# Patient Record
Sex: Female | Born: 1950 | Race: White | Hispanic: No | Marital: Married | Smoking: Current every day smoker
Health system: Southern US, Community
[De-identification: ages and names within clinical notes are randomized; demographics above are authoritative.]

## PROBLEM LIST (undated history)

## (undated) DIAGNOSIS — K625 Hemorrhage of anus and rectum: Secondary | ICD-10-CM

## (undated) DIAGNOSIS — G4700 Insomnia, unspecified: Secondary | ICD-10-CM

## (undated) DIAGNOSIS — G8929 Other chronic pain: Secondary | ICD-10-CM

## (undated) DIAGNOSIS — E785 Hyperlipidemia, unspecified: Secondary | ICD-10-CM

## (undated) DIAGNOSIS — M1990 Unspecified osteoarthritis, unspecified site: Secondary | ICD-10-CM

## (undated) DIAGNOSIS — J449 Chronic obstructive pulmonary disease, unspecified: Secondary | ICD-10-CM

## (undated) DIAGNOSIS — M549 Dorsalgia, unspecified: Secondary | ICD-10-CM

## (undated) DIAGNOSIS — F411 Generalized anxiety disorder: Secondary | ICD-10-CM

## (undated) HISTORY — DX: Hyperlipidemia, unspecified: E78.5

## (undated) HISTORY — DX: Insomnia, unspecified: G47.00

## (undated) HISTORY — DX: Unspecified osteoarthritis, unspecified site: M19.90

## (undated) HISTORY — DX: Generalized anxiety disorder: F41.1

## (undated) HISTORY — DX: Chronic obstructive pulmonary disease, unspecified: J44.9

## (undated) HISTORY — PX: DILATION AND CURETTAGE OF UTERUS: SHX78

## (undated) HISTORY — DX: Hemorrhage of anus and rectum: K62.5

## (undated) HISTORY — DX: Other chronic pain: G89.29

## (undated) HISTORY — DX: Dorsalgia, unspecified: M54.9

---

## 1997-10-04 ENCOUNTER — Ambulatory Visit (HOSPITAL_COMMUNITY): Admission: RE | Admit: 1997-10-04 | Discharge: 1997-10-04 | Payer: Self-pay | Admitting: Neurosurgery

## 1997-10-17 ENCOUNTER — Ambulatory Visit (HOSPITAL_COMMUNITY): Admission: RE | Admit: 1997-10-17 | Discharge: 1997-10-17 | Payer: Self-pay | Admitting: Neurosurgery

## 1997-10-31 ENCOUNTER — Ambulatory Visit (HOSPITAL_COMMUNITY): Admission: RE | Admit: 1997-10-31 | Discharge: 1997-10-31 | Payer: Self-pay | Admitting: Neurosurgery

## 1998-10-17 ENCOUNTER — Other Ambulatory Visit: Admission: RE | Admit: 1998-10-17 | Discharge: 1998-10-17 | Payer: Self-pay | Admitting: Obstetrics & Gynecology

## 1999-10-17 ENCOUNTER — Other Ambulatory Visit: Admission: RE | Admit: 1999-10-17 | Discharge: 1999-10-17 | Payer: Self-pay | Admitting: Obstetrics & Gynecology

## 2000-07-14 ENCOUNTER — Encounter: Payer: Self-pay | Admitting: Neurosurgery

## 2000-07-14 ENCOUNTER — Encounter: Admission: RE | Admit: 2000-07-14 | Discharge: 2000-07-14 | Payer: Self-pay | Admitting: Neurosurgery

## 2000-10-21 ENCOUNTER — Other Ambulatory Visit: Admission: RE | Admit: 2000-10-21 | Discharge: 2000-10-21 | Payer: Self-pay | Admitting: Obstetrics & Gynecology

## 2001-01-07 ENCOUNTER — Ambulatory Visit (HOSPITAL_COMMUNITY): Admission: RE | Admit: 2001-01-07 | Discharge: 2001-01-07 | Payer: Self-pay | Admitting: Obstetrics & Gynecology

## 2001-12-04 ENCOUNTER — Encounter: Payer: Self-pay | Admitting: Family Medicine

## 2001-12-04 ENCOUNTER — Ambulatory Visit (HOSPITAL_COMMUNITY): Admission: RE | Admit: 2001-12-04 | Discharge: 2001-12-04 | Payer: Self-pay | Admitting: Family Medicine

## 2002-03-25 ENCOUNTER — Other Ambulatory Visit: Admission: RE | Admit: 2002-03-25 | Discharge: 2002-03-25 | Payer: Self-pay | Admitting: Dermatology

## 2002-09-12 ENCOUNTER — Emergency Department (HOSPITAL_COMMUNITY): Admission: EM | Admit: 2002-09-12 | Discharge: 2002-09-12 | Payer: Self-pay | Admitting: Emergency Medicine

## 2002-09-14 ENCOUNTER — Ambulatory Visit (HOSPITAL_COMMUNITY): Admission: RE | Admit: 2002-09-14 | Discharge: 2002-09-14 | Payer: Self-pay | Admitting: Family Medicine

## 2002-09-14 ENCOUNTER — Encounter: Payer: Self-pay | Admitting: Family Medicine

## 2002-10-21 ENCOUNTER — Encounter (HOSPITAL_COMMUNITY): Admission: RE | Admit: 2002-10-21 | Discharge: 2002-11-20 | Payer: Self-pay | Admitting: Rheumatology

## 2002-10-28 ENCOUNTER — Encounter: Payer: Self-pay | Admitting: *Deleted

## 2002-10-28 ENCOUNTER — Ambulatory Visit (HOSPITAL_COMMUNITY): Admission: RE | Admit: 2002-10-28 | Discharge: 2002-10-28 | Payer: Self-pay | Admitting: *Deleted

## 2002-12-10 ENCOUNTER — Ambulatory Visit (HOSPITAL_COMMUNITY): Admission: RE | Admit: 2002-12-10 | Discharge: 2002-12-10 | Payer: Self-pay | Admitting: *Deleted

## 2003-04-07 ENCOUNTER — Other Ambulatory Visit: Admission: RE | Admit: 2003-04-07 | Discharge: 2003-04-07 | Payer: Self-pay | Admitting: Dermatology

## 2003-04-18 ENCOUNTER — Encounter: Payer: Self-pay | Admitting: Family Medicine

## 2003-04-18 ENCOUNTER — Ambulatory Visit (HOSPITAL_COMMUNITY): Admission: RE | Admit: 2003-04-18 | Discharge: 2003-04-18 | Payer: Self-pay | Admitting: Family Medicine

## 2003-11-23 ENCOUNTER — Ambulatory Visit (HOSPITAL_COMMUNITY): Admission: RE | Admit: 2003-11-23 | Discharge: 2003-11-23 | Payer: Self-pay | Admitting: *Deleted

## 2004-02-28 ENCOUNTER — Ambulatory Visit (HOSPITAL_COMMUNITY): Admission: RE | Admit: 2004-02-28 | Discharge: 2004-02-28 | Payer: Self-pay | Admitting: Family Medicine

## 2005-10-29 ENCOUNTER — Ambulatory Visit (HOSPITAL_COMMUNITY): Admission: RE | Admit: 2005-10-29 | Discharge: 2005-10-29 | Payer: Self-pay | Admitting: Family Medicine

## 2006-01-16 ENCOUNTER — Encounter: Admission: RE | Admit: 2006-01-16 | Discharge: 2006-01-16 | Payer: Self-pay | Admitting: Neurosurgery

## 2006-02-06 ENCOUNTER — Encounter: Admission: RE | Admit: 2006-02-06 | Discharge: 2006-02-06 | Payer: Self-pay | Admitting: Neurosurgery

## 2006-03-31 ENCOUNTER — Ambulatory Visit: Payer: Self-pay | Admitting: Gastroenterology

## 2006-11-24 ENCOUNTER — Ambulatory Visit (HOSPITAL_COMMUNITY): Admission: RE | Admit: 2006-11-24 | Discharge: 2006-11-24 | Payer: Self-pay | Admitting: Family Medicine

## 2007-08-07 ENCOUNTER — Ambulatory Visit (HOSPITAL_COMMUNITY): Admission: RE | Admit: 2007-08-07 | Discharge: 2007-08-07 | Payer: Self-pay | Admitting: General Surgery

## 2007-08-07 ENCOUNTER — Encounter (INDEPENDENT_AMBULATORY_CARE_PROVIDER_SITE_OTHER): Payer: Self-pay | Admitting: General Surgery

## 2007-10-19 ENCOUNTER — Other Ambulatory Visit: Admission: RE | Admit: 2007-10-19 | Discharge: 2007-10-19 | Payer: Self-pay | Admitting: Obstetrics and Gynecology

## 2008-03-17 ENCOUNTER — Ambulatory Visit (HOSPITAL_COMMUNITY): Admission: RE | Admit: 2008-03-17 | Discharge: 2008-03-17 | Payer: Self-pay | Admitting: Family Medicine

## 2009-02-06 ENCOUNTER — Other Ambulatory Visit: Admission: RE | Admit: 2009-02-06 | Discharge: 2009-02-06 | Payer: Self-pay | Admitting: Obstetrics and Gynecology

## 2009-03-21 ENCOUNTER — Ambulatory Visit (HOSPITAL_COMMUNITY): Admission: RE | Admit: 2009-03-21 | Discharge: 2009-03-21 | Payer: Self-pay | Admitting: Family Medicine

## 2010-03-27 ENCOUNTER — Ambulatory Visit (HOSPITAL_COMMUNITY): Admission: RE | Admit: 2010-03-27 | Discharge: 2010-03-27 | Payer: Self-pay | Admitting: Family Medicine

## 2010-07-15 ENCOUNTER — Encounter: Payer: Self-pay | Admitting: Neurosurgery

## 2010-07-15 ENCOUNTER — Encounter: Payer: Self-pay | Admitting: *Deleted

## 2010-07-15 ENCOUNTER — Encounter: Payer: Self-pay | Admitting: Family Medicine

## 2010-11-06 NOTE — H&P (Signed)
NAME:  Ellen Reynolds, Ellen Reynolds              ACCOUNT NO.:  [id number]   MEDICAL RECORD NO.:  [id number]          PATIENT TYPE:  AMB   LOCATION:  DAY                           FACILITY:  [facility]   PHYSICIAN:  Dalia Heading, M.D.  DATE OF BIRTH:  Jan 26, 1951   DATE OF ADMISSION:  DATE OF DISCHARGE:  LH                              HISTORY & PHYSICAL   CHIEF COMPLAINT:  Hematochezia.   HISTORY OF PRESENT ILLNESS:  The patient is a 60 year old white female  who is referred for endoscopic evaluation.  She needs a colonoscopy due  to hematochezia.  She has had some blood per rectum over the past few  weeks.  No abdominal pain, weight loss, nausea, vomiting, diarrhea,  constipation or melena have been noted.  She has never had a  colonoscopy.  There is no family history of colon carcinoma.   PAST MEDICAL HISTORY:  Unremarkable.   PAST SURGICAL HISTORY:  D&C's.   CURRENT MEDICATIONS:  Ibuprofen, Prilosec as needed.   ALLERGIES:  SULFA, CODEINE, MACRODANTIN.   REVIEW OF SYSTEMS:  The patient smokes a pack of cigarettes a day.  She  denies alcohol use.  She denies any cardiopulmonary difficulties or  bleeding disorders.   PHYSICAL EXAMINATION:  The patient is a well-developed, well-nourished  white female in no acute distress.  LUNGS:  Clear to auscultation with equal breath sounds bilaterally.  HEART:  A regular rate and rhythm without S3, S4 or murmurs.  ABDOMEN:  Soft, nontender, nondistended.  No hepatosplenomegaly or  masses are noted.  RECTAL:  Deferred to the procedure.   IMPRESSION:  Hematochezia.   PLAN:  The patient is scheduled for a colonoscopy on August 07, 2007.  The risks and benefits of the procedure including bleeding and  perforation were fully explained to the patient, who gave informed  consent.      Dalia Heading, M.D.  Electronically Signed     MAJ/MEDQ  D:  08/06/2007  T:  08/06/2007  Job:  [id number]   cc:   [facility]  Fax: [phone]   Corrie Mckusick, M.D.  Fax: [phone]

## 2010-11-09 NOTE — Consult Note (Signed)
NAMEMICHALA, Ellen Reynolds NO.:  [id number]   MEDICAL RECORD NO.:  [id number]          PATIENT TYPE:  AMB   LOCATION:  DAY                           FACILITY:  [facility]   PHYSICIAN:  Kassie Mends, M.D.      DATE OF BIRTH:  09-18-1950   DATE OF CONSULTATION:  03/31/2006  DATE OF DISCHARGE:                                   CONSULTATION   REFERRING PHYSICIAN:  Corrie Mckusick, M.D.   REASON FOR CONSULTATION:  Chest pain and difficulty swallowing.   HISTORY OF PRESENT ILLNESS:  Ellen Reynolds is a 60 year old female who  complains of a choking sensation that begins in her chest and radiates into  her neck up through her jaw into her teeth and into her ears. It makes both  her teeth and ears throb. The last episode was 3 weeks ago. It lasted  approximately 10 minutes. It occurred while she was driving. She has had a  total of 5 to 6 of these episodes over the last year. She has no triggers.  She has been awakened in the middle of the night with these episodes twice.  One has happened while driving, and the other has happened while watching  TV. The other episodes she cannot remember when they occurred. She was  started on Aciphex by Dr. Phillips Odor but says that the Aciphex was too  expensive, and she will not be continuing to take it. She does have  difficulty swallowing peanut butter less than once a month. She denies any  heartburn. She does have indigestion less than once a month. She denies any  nausea, vomiting, blood in her stool, black tarry stools, abdominal pain,  weight loss, constipation, or diarrhea.   PAST MEDICAL HISTORY:  1. COPD.  2. Back problems.   PAST SURGICAL HISTORY:  Multiple D&Cs.   ALLERGIES:  SULFA, MACRODANTIN AND CODEINE.   MEDICATIONS:  1. Aciphex 20 mg daily.  2. Naproxen 500 mg twice a day.  3. Coral calcium 1 g daily.  4. Spiriva.  5. Xopenex p.r.n.   FAMILY HISTORY:  She has no family history of colon cancer or colon polyps.  Her father  had melanoma at age 82.   SOCIAL HISTORY:  She is married and has one child 13 years ago. 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 She smokes a pack a day. She  denies any alcohol use.   REVIEW OF SYSTEMS:  She has one bowel movement daily. Review of systems is  per the HPI. Otherwise, all systems are negative.   PHYSICAL EXAMINATION:  Weight 159 pounds, height 5 foot 2 inches, BMI 29.1  (slightly overweight). Temperature 98.2, blood pressure 110/72, pulse 76.  GENERAL:  She is in no apparent distress, alert and oriented x4.  HEENT:  Atraumatic and normocephalic. Pupils are equal, round, and reactive  to light. Mouth:  No oral lesions. Posterior pharynx without erythema or  exudate.  NECK:  Has full range of motion. No lymphadenopathy.  LUNGS:  Clear to auscultation bilaterally.  CARDIOVASCULAR EXAM:  Regular rhythm. No murmur.  Normal S1 and S2.  ABDOMEN:  Bowel sounds are present, soft, nontender, nondistended. No  rebound or guarding. No hepatosplenomegaly.  EXTREMITIES:  Without clubbing, cyanosis, or edema.  NEUROLOGICAL:  She has no focal neurological deficits.   ASSESSMENT:  Ellen Reynolds is a 60 year old female with episodic choking  sensation in chest that radiates up into her ears which is likely  gastroesophageal reflux disease. She has been placed on appropriate therapy  which she says she cannot afford.  Thank you for allowing me to see Ms.  Reynolds in consultation. She also needs average-risk colon cancer screening  but is hesitant to have a colonoscopy because she did not want the large  volume liquid prep. My recommendations follow.   RECOMMENDATIONS:  1. Begin Prilosec over-the-counter 30 minutes prior to breakfast. She may      increase to twice a day if her symptoms persist. She should continue a      trial of PPI for 3 months.  2. She will be scheduled for colonoscopy with the OsmoPrep.  3. She is given the [facility] handout on self-care for  gastroesophageal      reflux disease and explained. I strongly encouraged her to loose 5 to      10 pounds, to stop smoking and to eat 4 to 6 meals per day and to avoid      wearing clothes that are too tight.  4. She will return to see me in 3 months. If her difficulty swallowing      peanut butter persists, then an upper endoscopy will be performed and      possibly esophageal dilation.   Please feel free to contact me, [phone], with additional questions.      Kassie Mends, M.D.  Electronically Signed     SM/MEDQ  D:  03/31/2006  T:  04/02/2006  Job:  [id number]   cc:   Corrie Mckusick, M.D.  Fax: [phone]

## 2010-11-09 NOTE — H&P (Signed)
NAME:  [name], [name]                        ACCOUNT NO.:  [id number]   MEDICAL RECORD NO.:  [id number]                   PATIENT TYPE:  AMB   LOCATION:  DAY                                  FACILITY:  [facility]   PHYSICIAN:  Langley Gauss, M.D.                DATE OF BIRTH:  09/02/1950   DATE OF ADMISSION:  12/10/2002  DATE OF DISCHARGE:  12/10/2002                                HISTORY & PHYSICAL   At the time of the operative procedure a short-form H&P had been completed.   HISTORY OF PRESENT ILLNESS:  Ellen Reynolds is a 60 year old white female,  gravida 3, para 1, two prior pregnancy losses and one live-born.  Pertinently, her history is the one live-born born at [redacted] weeks gestation  with a cerclage in place.  This is a 58-year-old daughter.  The patient  states she had the onset of menstrual difficulties following that 32-week  delivery.  Most pertinently, the patient at this point in time is  complaining of menstrual periods occurring every 9-10 days with four to five  days of flow as well as frequent hot flashes both during the day and the  night.  One year previously the menses were described as regular and  predictable.  However, this year she has had two skipped menstrual periods,  then next had two in April, April 1 to April 4, and then again April 25 to  October 21, 2002.  Currently by her history she states PMS precedes each cycle  and that she has pain down her right leg just prior to the onset of  bleeding.  The patient is also noted to have had a skipped menstrual period  in December and January as well as February, then had one menses in March,  two in April.   Pertinently in the patient's history, she has had multiple Ds&Cs in an  effort to control abnormal bleeding, and three years previously a  hysterectomy had been planned and was recommended.  However, the patient had  a D&C at that time which gave very short-term relief of her symptoms only.   Pertinently in  her history, she had been under the care of a [facility]  physician.  About three years previously an attempt was made to place an IUD  in an effort to produce endometrial atrophy and control the bleeding.  The  patient gives a very accurate history that at the time of attempted  placement of the IUD she had a stenotic cervix, and it was impossible to  place.   The patient does give a history that with her delivery 10 years previously  she had a retained placenta and that delivery itself went well, but she had  pretty extensive postpartum bleeding and about three to four weeks after  delivery required a D&C for removal of retained placental products.   PAST MEDICAL HISTORY:  She does smoke 1-1/2  packs per day.  She takes  Vicodin about two times per week.  She does exercise regularly.  She has one  pregnancy live-born, two pregnancy losses.  Pertinently, her second  pregnancy resulted in a 24-week pregnancy loss.  She is under the care of  Dr. Margo Aye for multiple seborrheic keratoses.   ALLERGIES:  No known drug allergies.   PHYSICAL EXAMINATION:  VITAL SIGNS:  164 pounds.  Blood pressure 122/70,  pulse of 84.  HEENT:  Negative.  No adenopathy.  NECK:  Supple.  Thyroid is not palpable.  LUNGS:  Clear.  CARDIOVASCULAR:  Regular rate and rhythm.  ABDOMEN:  Soft and nontender.  No surgical scars are identified.  EXTREMITIES:  Normal.  PELVIC:  Normal external genitalia.  Good anterior and posterior support.  No urethral detachment.  Good estrogen effect within the vaginal tissues.  The cervix itself is noted to be without lesions.  There was no active  bleeding.  Uterus and cervix, in addition, are well supported.  The cervix  does appear unusual in that the external cervical os is very small in  nature.  In addition, with bimanual examination of the cervix it is noted to  be only 1-2 cm in length.  Bimanual examination reveals a normal-sized  uterus with no adnexal masses.   Rectovaginal examination confirms the above.   LABORATORY DATA:  Hemoccult is noted to be negative.   Preoperative studies include a class I Pap smear.   Mammogram was normal in December 2003, with findings only of a 1.6 cm simple  cyst which had been seen previously.  A transvaginal ultrasound was  performed which revealed an 8.2 cm uterus; however, the endometrium was  abnormal, and it is mildly thickened, 1 cm in thickness, with hypoechoic  areas which were consistent with possible adenomyosis or endometrial polyps.  The ovaries themselves have been noted to be normal in appearance, with no  adnexal masses.   ASSESSMENT:  This is a 60 year old patient with with irregular bleeding by her  history.  She does have premenstrual syndrome prior to the onset of each  bleeding episode, but she is also experiencing significant hot flashes.  I  offered the patient use of Provera for continued use in an effort to induce  endometrial atrophy or proceeding with hysterectomy for definitive  management or proceeding with dilatation and curettage and hysteroscopy for  both diagnostic and therapeutic purposes.  The patient prefers at this time  to proceed with dilatation and curettage and hysteroscopy as she may  possibly be very close to menopause and would have a higher likelihood of  success than previous dilatations and curettages done.  Thus, the risks and  benefits of the operative procedure were discussed with the patient.  She  will be admitted for dilatation and curettage and hysteroscopy.  Pertinent  laboratory studies obtained prior to the procedure include hemoglobin 15,  hematocrit 43.6, white blood count 8.2.  Electrolytes within normal limits.  Liver function tests within normal limits.  FSH is elevated at 33.2, which  would be within the postmenopausal range.                                               Langley Gauss, M.D.   DC/MEDQ  D:  12/24/2002  T:  12/24/2002  Job:  [id number]

## 2010-11-09 NOTE — Op Note (Signed)
NAME:  Ellen Reynolds, Ellen Reynolds                        ACCOUNT NO.:  [id number]   MEDICAL RECORD NO.:  [id number]                   PATIENT TYPE:  AMB   LOCATION:  DAY                                  FACILITY:  [facility]   PHYSICIAN:  Langley Gauss, M.D.                DATE OF BIRTH:  1951-02-02   DATE OF PROCEDURE:  DATE OF DISCHARGE:  12/10/2002                                 OPERATIVE REPORT   PREOPERATIVE DIAGNOSES:  1. Irregular menses.  2. Menopausal symptoms.   POSTOPERATIVE DIAGNOSES:  1. Irregular menses.  2. Menopausal symptoms.   PROCEDURE:  Dilatation and curettage and hysteroscopy.   SURGEON:  Roylene Reason. Lisette Grinder, M.D.   ESTIMATED BLOOD LOSS:  Less than 100 cc   COMPLICATIONS:  None   SPECIMENS:  Uterine curettings for permanent section only.   ANESTHESIA:  General endotracheal   FINDINGS AT THE TIME OF SURGERY:  Include a very stenotic cervical os  requiring gentle dilatation.  Uterine cavity was noted to contain a moderate  amount of unstable appearing tissue which was removed through a combination  of hysteroscopic resection as well as dilatation and curettage.   SUMMARY:  The patient was taken to the operating room.  Vital signs were  stable.  The patient underwent uncomplicated induction of general  endotracheal anesthesia after which time she was placed in the low lithotomy  position.  A Foley catheter was placed to straight drainage with findings of  clear yellow urine.  A  sterile speculum examination was performed.  The  cervix is visualized and noted, as stated previously, to have very stenotic  cervix.   The anterior lip of the cervix was grasped with a single-tooth tenaculum and  the uterus was noted to sound to a depth of 8 cm.  In the course of passing  the uterine through the stenotic endocervical os a laceration did occur on  the anterior lip of the cervix where the single-tooth tenaculum was applied.  Thus the single-tooth tenaculum was reapplied on  the posterior lip of the  cervix.  Utilizing the Hegar dilators progressive dilatation was performed  very carefully up to a size #17 dilator.  The resistance encountered with  the cervical stenosis is overcome through the judicious use of progressive  dilatation.   With the cervix dilated to a #17 this allows passage of the operative  hysteroscope through the endocervical os and into the lower uterine segment.  Identified is a large amount of proliferative-appearing tissue with no  discrete tumors or polyps identified; so that this tissue would be obtained  during the dilatation and curettage, hysteroscopic scissors are introduced  through the operative hysteroscope and, as much as possible, this material  is cut at its origin near the uterine wall.  The uterine cavity otherwise is  noted to be normal in appearance.  The tubal ostia are not identified.  There was no evidence  of any uterine leiomyoma impinging upon the uterine  cavity.  The hysteroscope is then removed.  A small banjo curette is then  passed through the endocervical os at which time a laceration is noted to  have occurred at the posterior lip of the cervix where the single-tooth  tenaculum is placed.  It is then reapplied.   Aggressive dilatation having been performed the entire uterine cavity is  then curettaged with a small banjo curette with a moderate amount of tissue  obtained.  Curettage is continued in all quadrants of the uterus to include  the uterine fundus until a fine gritty sensation was appreciated in all  quadrants of the uterus and no further tissue was obtained.   The curettage is then discontinued.  The laceration sites at the anterior  and posterior lip of the cervix are then repaired utilizing #0 chromic in a  running lock fashion to restore the normal reapproximation of the tissue  edges.  The procedure is then terminated.  The patient is reversed of  anesthesia, taken to the recovery room in stable  condition after which time  the operative findings were discussed with the patient's awaiting family.  The patient has continued to drain clear yellow urine.                                               Langley Gauss, M.D.    DC/MEDQ  D:  12/16/2002  T:  12/16/2002  Job:  [id number]

## 2010-11-09 NOTE — Group Therapy Note (Signed)
NAME:  QUANITA, BARONA                        ACCOUNT NO.:  [id number]   MEDICAL RECORD NO.:  [id number]                   PATIENT TYPE:  OUT   LOCATION:  RDC                                  FACILITY:  [facility]   PHYSICIAN:  Kingsley Callander. Ouida Sills, M.D.                  DATE OF BIRTH:  [birth year]   DATE OF PROCEDURE:  01/05/2004  DATE OF DISCHARGE:  11/23/2003                                   PROGRESS NOTE   Ms. Stolze was seen at the [facility] on 7/14.  She appeared in her usual  neurologic condition.  There had been some small areas of bruising on her  right foot.  She has had no recurrent knee effusions.  Chest clear.  Heart  regular.  Abdomen soft.  Neurologic stable.   IMPRESSION:  1. Parkinson's stable.  2. Joint effusion resolved.      ___________________________________________                                            Kingsley Callander. Ouida Sills, M.D.   ROF/MEDQ  D:  01/20/2004  T:  01/20/2004  Job:  [id number]

## 2010-11-09 NOTE — Op Note (Signed)
   NAME:  Ellen Reynolds, Ellen Reynolds                        ACCOUNT NO.:  [id number]   MEDICAL RECORD NO.:  [id number]                   PATIENT TYPE:  AMB   LOCATION:  DAY                                  FACILITY:  [facility]   PHYSICIAN:  Langley Gauss, M.D.                DATE OF BIRTH:  28-Mar-1951   DATE OF PROCEDURE:  DATE OF DISCHARGE:  12/10/2002                                 OPERATIVE REPORT   The patient is discharged to home on the date of operative procedure, December 10, 2002.  She will follow up in the office in 1 week's time, at which time  operative findings as well as any pathology can again be discussed with the  patient.  She is given a copy of standardized discharge instructions. She  does have a prescription previously written for Lortab and she can also  taken this with Advil for postoperative pain relief.  The patient is  superficially advised that she may have some light bleeding times several  days to a week's duration.   PERTINENT LABORATORY STUDIES:  Electrolytes within normal limits.  Liver  function tests as well as renal studies within normal limits.  Hemoglobin is  15.0; hematocrit 43.6 with a white count of 8.2.  O positive blood type with  a negative antibody screen.  FSH not initially available has returned at  33.2 putting the patient within the postmenopausal range.  Thus, hopefully a  combination of the patient being within the postmenopausal status and the  curettage having been performed, and the lack of estrogen stimulation,  hopefully, the patient will not have any resultant recurrence of this  abnormal uterine bleeding.   As stated previously the patient is to follow up in the office in 1 week's  time.                                               Langley Gauss, M.D.    DC/MEDQ  D:  12/16/2002  T:  12/16/2002  Job:  [id number]

## 2011-01-03 NOTE — H&P (Signed)
Ellen Reynolds is an 60 y.o. female.   Chief Complaint: *Need for follow up of colon polyps** HPI: **61yo wf who presents for follow up TCS.  Last had TCS with polypectomy in 2009.  Benign polyp removed.*  No past medical history on file.  No past surgical history on file.  No family history on file. Social History:  does not have a smoking history on file. She does not have any smokeless tobacco history on file. Her alcohol and drug histories not on file.  Allergies: Allergies not on file  No current facility-administered medications on file as of .   No current outpatient prescriptions on file as of .    No results found for this or any previous visit (from the past 48 hour(s)). No results found.  Review of Systems  Constitutional: Negative.   HENT: Negative.   Eyes: Negative.   Respiratory: Positive for shortness of breath.   Cardiovascular: Negative.   Gastrointestinal: Negative.   Genitourinary: Negative.   Musculoskeletal: Negative.   Skin: Negative.   Neurological: Negative.   Endo/Heme/Allergies: Negative.   Psychiatric/Behavioral: Negative.     There were no vitals taken for this visit. Physical Exam  Constitutional: She is oriented to person, place, and time. She appears well-developed and well-nourished.  HENT:  Head: Normocephalic and atraumatic.  Eyes: Pupils are equal, round, and reactive to light.  Neck: Normal range of motion. Neck supple.  Cardiovascular: Normal rate, regular rhythm and normal heart sounds.   Respiratory: Effort normal and breath sounds normal.  GI: Soft. Bowel sounds are normal.  Musculoskeletal: Normal range of motion.  Neurological: She is alert and oriented to person, place, and time.  Skin: Skin is warm and dry.  Psychiatric: She has a normal mood and affect. Her behavior is normal. Judgment and thought content normal.     Assessment/Plan H/o colon polyp Scheduled for TCS on 01/22/11.**  Ellen Reynolds A 01/03/2011, 4:01  PM

## 2011-01-22 ENCOUNTER — Ambulatory Visit (HOSPITAL_COMMUNITY)
Admission: RE | Admit: 2011-01-22 | Discharge: 2011-01-22 | Disposition: A | Discharge status: Home / Self Care | Payer: Managed Care, Other (non HMO) | Source: Ambulatory Visit | Attending: General Surgery | Admitting: General Surgery

## 2011-01-22 ENCOUNTER — Encounter (HOSPITAL_COMMUNITY)
Admission: RE | Disposition: A | Discharge status: Home / Self Care | Payer: Self-pay | Source: Ambulatory Visit | Attending: General Surgery

## 2011-01-22 DIAGNOSIS — Z860100 Personal history of colon polyps, unspecified: Secondary | ICD-10-CM | POA: Insufficient documentation

## 2011-01-22 DIAGNOSIS — Z09 Encounter for follow-up examination after completed treatment for conditions other than malignant neoplasm: Secondary | ICD-10-CM | POA: Insufficient documentation

## 2011-01-22 HISTORY — PX: COLONOSCOPY: SHX5424

## 2011-01-22 SURGERY — COLONOSCOPY
Anesthesia: Moderate Sedation

## 2011-01-22 MED ORDER — MEPERIDINE HCL 100 MG/ML IJ SOLN
INTRAMUSCULAR | Status: AC
  Filled 2011-01-22: qty 2

## 2011-01-22 MED ORDER — MIDAZOLAM HCL 5 MG/5ML IJ SOLN
INTRAMUSCULAR | Status: DC | PRN
  Administered 2011-01-22: 1 mg via INTRAVENOUS
  Administered 2011-01-22: 3 mg via INTRAVENOUS

## 2011-01-22 MED ORDER — STERILE WATER FOR IRRIGATION IR SOLN
Status: DC | PRN
  Administered 2011-01-22: 08:00:00

## 2011-01-22 MED ORDER — MEPERIDINE HCL 25 MG/ML IJ SOLN
INTRAMUSCULAR | Status: DC | PRN
  Administered 2011-01-22: 25 mg via INTRAVENOUS
  Administered 2011-01-22: 50 mg via INTRAVENOUS

## 2011-01-22 MED ORDER — MIDAZOLAM HCL 5 MG/5ML IJ SOLN
INTRAMUSCULAR | Status: AC
  Filled 2011-01-22: qty 10

## 2011-02-01 ENCOUNTER — Encounter (HOSPITAL_COMMUNITY): Payer: Self-pay | Admitting: General Surgery

## 2011-04-01 ENCOUNTER — Other Ambulatory Visit: Payer: Self-pay | Admitting: Family Medicine

## 2011-04-01 DIAGNOSIS — Z139 Encounter for screening, unspecified: Secondary | ICD-10-CM

## 2011-04-09 ENCOUNTER — Other Ambulatory Visit (HOSPITAL_COMMUNITY)
Admission: RE | Admit: 2011-04-09 | Discharge: 2011-04-09 | Disposition: A | Discharge status: Home / Self Care | Payer: Managed Care, Other (non HMO) | Source: Ambulatory Visit | Attending: Obstetrics and Gynecology | Admitting: Obstetrics and Gynecology

## 2011-04-09 ENCOUNTER — Ambulatory Visit (HOSPITAL_COMMUNITY)
Admission: RE | Admit: 2011-04-09 | Discharge: 2011-04-09 | Disposition: A | Discharge status: Home / Self Care | Payer: Managed Care, Other (non HMO) | Source: Ambulatory Visit | Attending: Family Medicine | Admitting: Family Medicine

## 2011-04-09 ENCOUNTER — Other Ambulatory Visit: Payer: Self-pay | Admitting: Adult Health

## 2011-04-09 DIAGNOSIS — Z1231 Encounter for screening mammogram for malignant neoplasm of breast: Secondary | ICD-10-CM | POA: Insufficient documentation

## 2011-04-09 DIAGNOSIS — Z139 Encounter for screening, unspecified: Secondary | ICD-10-CM

## 2011-04-09 DIAGNOSIS — Z01419 Encounter for gynecological examination (general) (routine) without abnormal findings: Secondary | ICD-10-CM | POA: Insufficient documentation

## 2012-03-31 ENCOUNTER — Other Ambulatory Visit: Payer: Self-pay | Admitting: Physician Assistant

## 2012-03-31 DIAGNOSIS — Z139 Encounter for screening, unspecified: Secondary | ICD-10-CM

## 2012-04-14 ENCOUNTER — Ambulatory Visit (HOSPITAL_COMMUNITY)
Admission: RE | Admit: 2012-04-14 | Discharge: 2012-04-14 | Disposition: A | Discharge status: Home / Self Care | Payer: Managed Care, Other (non HMO) | Source: Ambulatory Visit | Attending: Physician Assistant | Admitting: Physician Assistant

## 2012-04-14 DIAGNOSIS — Z1231 Encounter for screening mammogram for malignant neoplasm of breast: Secondary | ICD-10-CM | POA: Insufficient documentation

## 2012-04-14 DIAGNOSIS — Z139 Encounter for screening, unspecified: Secondary | ICD-10-CM

## 2013-01-05 ENCOUNTER — Other Ambulatory Visit (HOSPITAL_COMMUNITY): Payer: Self-pay

## 2013-01-05 DIAGNOSIS — J441 Chronic obstructive pulmonary disease with (acute) exacerbation: Secondary | ICD-10-CM

## 2013-01-12 ENCOUNTER — Ambulatory Visit (HOSPITAL_COMMUNITY)
Admission: RE | Admit: 2013-01-12 | Discharge: 2013-01-12 | Disposition: A | Discharge status: Home / Self Care | Payer: 59 | Source: Ambulatory Visit | Attending: Internal Medicine | Admitting: Internal Medicine

## 2013-01-12 DIAGNOSIS — J441 Chronic obstructive pulmonary disease with (acute) exacerbation: Secondary | ICD-10-CM | POA: Insufficient documentation

## 2013-01-12 MED ORDER — ALBUTEROL SULFATE (5 MG/ML) 0.5% IN NEBU
2.5000 mg | INHALATION_SOLUTION | Freq: Once | RESPIRATORY_TRACT | Status: AC
  Administered 2013-01-12: 2.5 mg via RESPIRATORY_TRACT

## 2013-01-14 NOTE — Procedures (Signed)
Ellen Reynolds, Ellen Reynolds              ACCOUNT NO.:  [id number]  MEDICAL RECORD NO.:  [id number]  LOCATION:  RESP                          FACILITY:  [facility]  PHYSICIAN:  Gatha Mcnulty L. Juanetta Gosling, M.D.DATE OF BIRTH:  [birth year]  DATE OF PROCEDURE: DATE OF DISCHARGE:  01/12/2013                           PULMONARY FUNCTION TEST   Reason for pulmonary function testing is COPD exacerbation.     Mairlyn Tegtmeyer L. Juanetta Gosling, M.D.     ELH/MEDQ  D:  01/13/2013  T:  01/14/2013  Job:  [id number]  cc:   Catalina Pizza, M.D. Fax: [phone]

## 2013-01-14 NOTE — Procedures (Signed)
NAMEADELENE, Ellen Reynolds              ACCOUNT NO.:  [id number]  MEDICAL RECORD NO.:  [id number]  LOCATION:  RESP                          FACILITY:  [facility]  PHYSICIAN:  Giovan Pinsky L. Juanetta Gosling, M.D.DATE OF BIRTH:  1950-11-01  DATE OF PROCEDURE: DATE OF DISCHARGE:  01/12/2013                           PULMONARY FUNCTION TEST   Patient of Dr. Catalina Pizza.  Reason for pulmonary function testing is COPD exacerbation. 1. Spirometry shows a mild ventilatory defect with evidence of airflow     obstruction. 2. There is no significant bronchodilator improvement. 3. This is consistent with COPD.     Dagan Heinz L. Juanetta Gosling, M.D.     ELH/MEDQ  D:  01/13/2013  T:  01/14/2013  Job:  [id number]  cc:   Catalina Pizza, M.D. Fax: [phone]

## 2013-01-27 LAB — PULMONARY FUNCTION TEST

## 2013-03-10 ENCOUNTER — Encounter (INDEPENDENT_AMBULATORY_CARE_PROVIDER_SITE_OTHER): Payer: Self-pay | Admitting: *Deleted

## 2013-03-26 ENCOUNTER — Other Ambulatory Visit (HOSPITAL_COMMUNITY): Payer: Self-pay | Admitting: Internal Medicine

## 2013-03-26 DIAGNOSIS — Z139 Encounter for screening, unspecified: Secondary | ICD-10-CM

## 2013-04-13 ENCOUNTER — Ambulatory Visit (INDEPENDENT_AMBULATORY_CARE_PROVIDER_SITE_OTHER): Payer: 59 | Admitting: Internal Medicine

## 2013-04-13 ENCOUNTER — Encounter (INDEPENDENT_AMBULATORY_CARE_PROVIDER_SITE_OTHER): Payer: Self-pay | Admitting: Internal Medicine

## 2013-04-13 VITALS — BP 144/60 | HR 80 | Temp 98.9°F | Ht 62.0 in | Wt 152.7 lb

## 2013-04-13 DIAGNOSIS — J441 Chronic obstructive pulmonary disease with (acute) exacerbation: Secondary | ICD-10-CM | POA: Insufficient documentation

## 2013-04-13 DIAGNOSIS — K625 Hemorrhage of anus and rectum: Secondary | ICD-10-CM

## 2013-04-13 LAB — CBC WITH DIFFERENTIAL/PLATELET
Hemoglobin: 14.5 g/dL (ref 12.0–15.0)
Lymphocytes Relative: 40 % (ref 12–46)
MCH: 30.1 pg (ref 26.0–34.0)
Monocytes Absolute: 0.7 10*3/uL (ref 0.1–1.0)
Neutro Abs: 4.7 10*3/uL (ref 1.7–7.7)
Neutrophils Relative %: 47 % (ref 43–77)
Platelets: 301 10*3/uL (ref 150–400)
RBC: 4.82 MIL/uL (ref 3.87–5.11)
RDW: 13.9 % (ref 11.5–15.5)
WBC: 10 10*3/uL (ref 4.0–10.5)

## 2013-04-13 NOTE — Progress Notes (Signed)
Subjective:     Patient ID: Ellen Reynolds, female   DOB: 01-24-1951, 62 y.o.   MRN: [id number]  HPI 62 yr old female referred to our office by Dr. Dwana Melena for blood in stool/colonoscopy. She has seen blood for the past year.  When she has a BM she wipes she will see bright red blood. She has had rectal bleeding several months. Her stools have not been hard.  She saw Dr. Lovell Sheehan and he thought she may have a hemorrhoids. She was told not to strain when she had a BM. Four the past 4 days she has seen blood when she wipes.. She is not straining to have a BM. Appetite is good. No weight loss. Her BMs are brown. No change in her stool. Sometimes she has lower abdominal cramp.   Her mother 's sister died from colon cancer age 21. She was diagnosed 9 months before she died.    Last colonoscopy as in 2012 by Dr. Lovell Sheehan for surveillance of polyps which were adenomas: Impression: Normal colon,  Review of Systems   See hpi  Current Outpatient Prescriptions  Medication Sig Dispense Refill  . Aclidinium Bromide (TUDORZA PRESSAIR) 400 MCG/ACT AEPB Inhale into the lungs.      . calcium citrate-vitamin D (CITRACAL+D) 315-200 MG-UNIT per tablet Take 1 tablet by mouth 2 (two) times daily.      . traMADol (ULTRAM) 50 MG tablet Take 50 mg by mouth every 6 (six) hours as needed for pain.       No current facility-administered medications for this visit.   Past Medical History  Diagnosis Date  . COPD (chronic obstructive pulmonary disease)    Past Surgical History  Procedure Laterality Date  . Colonoscopy  01/22/2011    Procedure: COLONOSCOPY;  Surgeon: Dalia Heading;  [facility];  Service: Gastroenterology;  Laterality: N/A;  . Dilation and curettage of uterus     Allergies  Allergen Reactions  . Codeine Nausea Only  . Macrodantin Hives  . Sulfa Antibiotics Hives            Objective:   Physical Exam  Filed Vitals:   04/13/13 1508  BP: 144/60  Pulse: 80  Temp:  98.9 F (37.2 C)  Height: 5\' 2"  (1.575 m)  Weight: 152 lb 11.2 oz (69.264 kg)   Alert and oriented. Skin warm and dry. Oral mucosa is moist.   . Sclera anicteric, conjunctivae is pink. Thyroid not enlarged. No cervical lymphadenopathy. Lungs clear. Heart regular rate and rhythm.  Abdomen is soft. Bowel sounds are positive. No hepatomegaly. No abdominal masses felt. No tenderness.  No edema to lower extremities. Stool brown an guaiac negative.     Assessment:    Rectal bleeding. Colonic neoplasm needs to be ruled out. Colon polyps are also in the differential given her hx.      Plan:   Colonoscopy. CBC today to be sure she is not anemic.

## 2013-04-13 NOTE — Patient Instructions (Signed)
Colonoscopy with Dr. Rehman. The risks and benefits such as perforation, bleeding, and infection were reviewed with the patient and is agreeable. 

## 2013-04-14 ENCOUNTER — Other Ambulatory Visit (INDEPENDENT_AMBULATORY_CARE_PROVIDER_SITE_OTHER): Payer: Self-pay | Admitting: *Deleted

## 2013-04-14 ENCOUNTER — Telehealth (INDEPENDENT_AMBULATORY_CARE_PROVIDER_SITE_OTHER): Payer: Self-pay | Admitting: *Deleted

## 2013-04-14 ENCOUNTER — Encounter (INDEPENDENT_AMBULATORY_CARE_PROVIDER_SITE_OTHER): Payer: Self-pay | Admitting: *Deleted

## 2013-04-14 DIAGNOSIS — K625 Hemorrhage of anus and rectum: Secondary | ICD-10-CM

## 2013-04-14 DIAGNOSIS — Z1211 Encounter for screening for malignant neoplasm of colon: Secondary | ICD-10-CM

## 2013-04-14 NOTE — Telephone Encounter (Signed)
Patient needs movi prep 

## 2013-04-16 MED ORDER — PEG-KCL-NACL-NASULF-NA ASC-C 100 G PO SOLR: 1.0000 | Freq: Once | ORAL | Status: DC

## 2013-04-20 ENCOUNTER — Ambulatory Visit (HOSPITAL_COMMUNITY): Payer: 59

## 2013-05-24 ENCOUNTER — Telehealth (INDEPENDENT_AMBULATORY_CARE_PROVIDER_SITE_OTHER): Payer: Self-pay | Admitting: *Deleted

## 2013-05-24 ENCOUNTER — Encounter (HOSPITAL_COMMUNITY): Payer: Self-pay | Admitting: Pharmacy Technician

## 2013-05-24 NOTE — Telephone Encounter (Signed)
Patient called and states that she is very concerned about taking the Movi\ Prep as it has Polyethlene in it. The last two she had made her very sick. She wants Dr.Rehman to let her know what else she can take.

## 2013-05-24 NOTE — Telephone Encounter (Signed)
Can use Visicol prep

## 2013-05-24 NOTE — Telephone Encounter (Signed)
After talking with Reba about the instructions for the prep that Dr.Rehman is now recommending, the patient was called and a message was left asking that she the patient call the office around 11 am or at 2:30 pm tomorrow. Ann ,referral coordinator would be back in office and would go over the instructions with her and this would be called to her Pharmacy at that time.

## 2013-05-25 ENCOUNTER — Telehealth (INDEPENDENT_AMBULATORY_CARE_PROVIDER_SITE_OTHER): Payer: Self-pay | Admitting: *Deleted

## 2013-05-25 DIAGNOSIS — Z1211 Encounter for screening for malignant neoplasm of colon: Secondary | ICD-10-CM

## 2013-05-25 MED ORDER — SOD PHOS MONO-SOD PHOS DIBASIC 1.102-0.398 G PO TABS: 1.0000 | ORAL_TABLET | Freq: Once | ORAL | Status: AC

## 2013-05-25 NOTE — Telephone Encounter (Signed)
Patient needs osmo pill prep 

## 2013-05-25 NOTE — Telephone Encounter (Signed)
New RX sent to pharmacy for osmo pill prep, patient aware

## 2013-05-27 ENCOUNTER — Encounter (HOSPITAL_COMMUNITY): Admission: RE | Payer: Self-pay | Source: Ambulatory Visit

## 2013-05-27 ENCOUNTER — Ambulatory Visit (HOSPITAL_COMMUNITY): Admission: RE | Admit: 2013-05-27 | Payer: 59 | Source: Ambulatory Visit | Admitting: Internal Medicine

## 2013-05-27 SURGERY — COLONOSCOPY
Anesthesia: Moderate Sedation

## 2013-06-01 ENCOUNTER — Ambulatory Visit (HOSPITAL_COMMUNITY)
Admission: RE | Admit: 2013-06-01 | Discharge: 2013-06-01 | Disposition: A | Discharge status: Home / Self Care | Payer: 59 | Source: Ambulatory Visit | Attending: Internal Medicine | Admitting: Internal Medicine

## 2013-06-01 DIAGNOSIS — Z1231 Encounter for screening mammogram for malignant neoplasm of breast: Secondary | ICD-10-CM | POA: Insufficient documentation

## 2013-06-01 DIAGNOSIS — Z139 Encounter for screening, unspecified: Secondary | ICD-10-CM

## 2014-04-25 ENCOUNTER — Other Ambulatory Visit (HOSPITAL_COMMUNITY): Payer: Self-pay | Admitting: Internal Medicine

## 2014-04-25 DIAGNOSIS — Z1231 Encounter for screening mammogram for malignant neoplasm of breast: Secondary | ICD-10-CM

## 2014-06-13 ENCOUNTER — Ambulatory Visit (HOSPITAL_COMMUNITY)
Admission: RE | Admit: 2014-06-13 | Discharge: 2014-06-13 | Disposition: A | Discharge status: Home / Self Care | Payer: 59 | Source: Ambulatory Visit | Attending: Internal Medicine | Admitting: Internal Medicine

## 2014-06-13 DIAGNOSIS — Z1231 Encounter for screening mammogram for malignant neoplasm of breast: Secondary | ICD-10-CM | POA: Insufficient documentation

## 2014-11-07 ENCOUNTER — Encounter: Payer: Self-pay | Admitting: Obstetrics & Gynecology

## 2015-05-15 ENCOUNTER — Other Ambulatory Visit (HOSPITAL_COMMUNITY): Payer: Self-pay | Admitting: Internal Medicine

## 2015-05-15 DIAGNOSIS — Z1231 Encounter for screening mammogram for malignant neoplasm of breast: Secondary | ICD-10-CM

## 2015-06-21 ENCOUNTER — Ambulatory Visit (HOSPITAL_COMMUNITY)
Admission: RE | Admit: 2015-06-21 | Discharge: 2015-06-21 | Disposition: A | Discharge status: Home / Self Care | Payer: 59 | Source: Ambulatory Visit | Attending: Internal Medicine | Admitting: Internal Medicine

## 2015-06-21 DIAGNOSIS — Z1231 Encounter for screening mammogram for malignant neoplasm of breast: Secondary | ICD-10-CM | POA: Diagnosis present

## 2015-07-26 ENCOUNTER — Telehealth: Payer: Self-pay | Admitting: Orthopaedic Surgery

## 2015-07-26 NOTE — Telephone Encounter (Signed)
Patient called and wanted refills on Tramadol and Flexeril

## 2015-07-26 NOTE — Telephone Encounter (Signed)
Routing to Dr. Keeling 

## 2015-07-27 MED ORDER — TRAMADOL HCL 50 MG PO TABS: 50.0000 mg | ORAL_TABLET | Freq: Four times a day (QID) | ORAL | Status: DC | PRN

## 2015-07-27 MED ORDER — CYCLOBENZAPRINE HCL 10 MG PO TABS: 10.0000 mg | ORAL_TABLET | Freq: Three times a day (TID) | ORAL | Status: DC

## 2015-07-27 NOTE — Telephone Encounter (Signed)
Rx printed

## 2015-12-13 ENCOUNTER — Telehealth: Payer: Self-pay | Admitting: Orthopaedic Surgery

## 2015-12-13 NOTE — Telephone Encounter (Signed)
Ellen NixonJanice called saying that her back, right shoulder, leg and around her ankles are hurting real bad. She is asking is there anything you can give her that is stronger than Tramadol?

## 2015-12-13 NOTE — Telephone Encounter (Signed)
No.  She can double up on the pills for a while every six to eight hours.

## 2015-12-13 NOTE — Telephone Encounter (Signed)
She said that if she takes a lot of Vicodin it makes her sick on her stomach. Percocet makes her deathly sick.  She asked if there was anything in the Tramadol family any stronger?

## 2015-12-13 NOTE — Telephone Encounter (Signed)
Can she take hydrocodeine?  Vicodin?  If so, let me know.

## 2016-01-29 ENCOUNTER — Encounter: Payer: Self-pay | Admitting: *Deleted

## 2016-02-08 ENCOUNTER — Encounter: Payer: Self-pay | Admitting: Cardiology

## 2016-02-08 NOTE — Progress Notes (Signed)
Cardiology Office Note  Date: 02/09/2016   ID: Ellen Reynolds, Ellen Reynolds [birth year], MRN [id number]  PCP: Wende Neighbors, MD  Consulting Cardiologist: Rozann Lesches, MD   Chief Complaint  Patient presents with  . Chest discomfort    History of Present Illness: Ellen Reynolds is a 65 y.o. female referred for cardiology consultation by Dr. Nevada Crane. I reviewed the available records and updated in the chart. She presents reporting a history of recurring chest discomfort for the last 2-3 years at least. She describes a full feeling that begins in her lower sternal area and then moves up into her neck and lower jaw. This is very sporadic, not necessarily induced by exertion, usually last for only a minute or so. She does not report any dysphagia. She has had no palpitations or syncope. She describes herself as being very active with her house chores, including doing all of her own yard work (uses a Chiropractor). She has not undergone any ischemic testing. She asked for referral to a cardiologist because she was worried about her heart.  I reviewed her ECG today which is normal. She has a history of COPD with tobacco use, also reported hyperlipidemia. Otherwise no major cardiac risk factors. No clear history of premature CAD in her family.  Past Medical History:  Diagnosis Date  . Chronic back pain   . COPD (chronic obstructive pulmonary disease) ([place])   . Generalized anxiety disorder   . Hyperlipidemia   . Insomnia     Past Surgical History:  Procedure Laterality Date  . COLONOSCOPY  01/22/2011   Procedure: COLONOSCOPY;  Surgeon: Jamesetta So;  [facility];  Service: Gastroenterology;  Laterality: N/A;  . DILATION AND CURETTAGE OF UTERUS      Current Outpatient Prescriptions  Medication Sig Dispense Refill  . Aclidinium Bromide (TUDORZA PRESSAIR) 400 MCG/ACT AEPB Inhale into the lungs.    . calcium citrate-vitamin D (CITRACAL+D) 315-200 MG-UNIT per tablet Take 1 tablet by mouth  2 (two) times daily.    . cyclobenzaprine (FLEXERIL) 10 MG tablet Take 1 tablet (10 mg total) by mouth 3 (three) times daily. (Patient taking differently: Take 10 mg by mouth 3 (three) times daily as needed. ) 90 tablet 3  . naproxen sodium (ANAPROX) 220 MG tablet Take 220 mg by mouth daily.    . sodium phosphates (OSMOPREP) 1.102-0.398 G TABS Take 1 tablet by mouth once. 32 tablet 0  . traMADol (ULTRAM) 50 MG tablet Take 1 tablet (50 mg total) by mouth every 6 (six) hours as needed. 60 tablet 3   No current facility-administered medications for this visit.    Allergies:  Codeine; Macrodantin; and Sulfa antibiotics   Social History: The patient  reports that she has been smoking.  She has never used smokeless tobacco. She reports that she does not drink alcohol or use drugs.   Family History: The patient's family history includes Colon cancer in her maternal aunt; Diabetes Mellitus II in her father; Hypertension in her father; Melanoma in her father; Multiple myeloma in her mother.   ROS:  Please see the history of present illness. Otherwise, complete review of systems is positive for arthritic pains in her back and knees.  All other systems are reviewed and negative.   Physical Exam: VS:  BP 132/84   Pulse 79   Ht '5\' 2"'  (1.575 m)   Wt 156 lb (70.8 kg)   SpO2 95%   BMI 28.53 kg/m , BMI Body mass  index is 28.53 kg/m.  Wt Readings from Last 3 Encounters:  02/09/16 156 lb (70.8 kg)  04/13/13 152 lb 11.2 oz (69.3 kg)  01/22/11 137 lb (62.1 kg)    General: Patient appears comfortable at rest. HEENT: Conjunctiva and lids normal, oropharynx clear. Neck: Supple, no elevated JVP or carotid bruits, no thyromegaly. Lungs: Clear to auscultation, nonlabored breathing at rest. Cardiac: Regular rate and rhythm, S4, no significant systolic murmur, no pericardial rub. Abdomen: Soft, nontender, bowel sounds present, no guarding or rebound. Extremities: No pitting edema, distal pulses 2+. Skin:  Warm and dry. Musculoskeletal: No kyphosis. Neuropsychiatric: Alert and oriented x3, affect grossly appropriate.  ECG: No old tracing is available for comparison.  Assessment and Plan:  1. Recurring episodes of fairly atypical chest discomfort as outlined above. Baseline ECG is normal. She does have a history of tobacco use and hyperlipidemia, otherwise no major known cardiac risk factors. She has never undergone any prior ischemic testing. Could be that her symptoms are GI in etiology based on description (esophageal spasm or reflux). She has been hesitant to try Nexium which was recommended by Dr. Nevada Crane. From a cardiac perspective we will arrange a standard GXT. If this is reassuring, she will follow back up with Dr. Nevada Crane to investigate other possible etiologies of her symptoms.  2. Hyperlipidemia by history. Do not have access to her recent lab work.  3. Tobacco abuse with COPD.  4. History of anxiety. Patient states that she has been told in the past that her symptoms are related to "panic attacks." There does not seem to be any specific correlation of these symptoms with anxiety however.  Current medicines were reviewed with the patient today.   Orders Placed This Encounter  Procedures  . EXERCISE TOLERANCE TEST  . EKG 12-Lead    Disposition: Call with results.  Signed, Satira Sark, MD, [facility] 02/09/2016 8:49 AM    [facility] [place], [place], [place] [address] Phone: [phone]; Fax: [phone]

## 2016-02-09 ENCOUNTER — Encounter: Payer: Self-pay | Admitting: *Deleted

## 2016-02-09 ENCOUNTER — Ambulatory Visit (INDEPENDENT_AMBULATORY_CARE_PROVIDER_SITE_OTHER): Payer: 59 | Admitting: Cardiology

## 2016-02-09 ENCOUNTER — Encounter: Payer: Self-pay | Admitting: Cardiology

## 2016-02-09 VITALS — BP 132/84 | HR 79 | Ht 62.0 in | Wt 156.0 lb

## 2016-02-09 DIAGNOSIS — R0789 Other chest pain: Secondary | ICD-10-CM

## 2016-02-09 DIAGNOSIS — E785 Hyperlipidemia, unspecified: Secondary | ICD-10-CM | POA: Diagnosis not present

## 2016-02-09 DIAGNOSIS — J449 Chronic obstructive pulmonary disease, unspecified: Secondary | ICD-10-CM

## 2016-02-09 DIAGNOSIS — Z720 Tobacco use: Secondary | ICD-10-CM | POA: Diagnosis not present

## 2016-02-09 DIAGNOSIS — R072 Precordial pain: Secondary | ICD-10-CM | POA: Insufficient documentation

## 2016-02-09 NOTE — Patient Instructions (Signed)
Medication Instructions:  Continue all current medications.  Labwork: none  Testing/Procedures:  Your physician has requested that you have an exercise tolerance test. For further information please visit [email or web address]. Please also follow instruction sheet, as given.  Office will contact with results via phone or letter.    Follow-Up: To be determined   Any Other Special Instructions Will Be Listed Below (If Applicable).  If you need a refill on your cardiac medications before your next appointment, please call your pharmacy.  

## 2016-02-13 ENCOUNTER — Telehealth: Payer: Self-pay | Admitting: Cardiology

## 2016-02-13 NOTE — Telephone Encounter (Signed)
Mrs[facility] Ellen [facility]helton called [facility] not [facility]. She is going to have to cancel procedure for now until she can figure out another way to help pay the bill.

## 2016-02-16 ENCOUNTER — Inpatient Hospital Stay (HOSPITAL_COMMUNITY): Admission: RE | Admit: 2016-02-16 | Payer: 59 | Source: Ambulatory Visit

## 2016-04-02 ENCOUNTER — Telehealth: Payer: Self-pay | Admitting: Orthopaedic Surgery

## 2016-04-02 MED ORDER — TRAMADOL HCL 50 MG PO TABS: 50.0000 mg | ORAL_TABLET | Freq: Four times a day (QID) | ORAL | 3 refills | Status: DC | PRN

## 2016-04-02 NOTE — Telephone Encounter (Signed)
Tramadol (Ultram) 50 Mg  Qty 60 Tablets

## 2016-05-24 ENCOUNTER — Other Ambulatory Visit (HOSPITAL_COMMUNITY): Payer: Self-pay | Admitting: Internal Medicine

## 2016-05-24 DIAGNOSIS — Z1231 Encounter for screening mammogram for malignant neoplasm of breast: Secondary | ICD-10-CM

## 2016-06-21 ENCOUNTER — Ambulatory Visit (HOSPITAL_COMMUNITY)
Admission: RE | Admit: 2016-06-21 | Discharge: 2016-06-21 | Disposition: A | Discharge status: Home / Self Care | Payer: 59 | Source: Ambulatory Visit | Attending: Internal Medicine | Admitting: Internal Medicine

## 2016-06-21 DIAGNOSIS — Z1231 Encounter for screening mammogram for malignant neoplasm of breast: Secondary | ICD-10-CM | POA: Insufficient documentation

## 2016-07-11 ENCOUNTER — Ambulatory Visit: Payer: 59 | Admitting: Orthopaedic Surgery

## 2016-07-17 ENCOUNTER — Ambulatory Visit (INDEPENDENT_AMBULATORY_CARE_PROVIDER_SITE_OTHER): Payer: 59

## 2016-07-17 ENCOUNTER — Encounter: Payer: Self-pay | Admitting: Orthopaedic Surgery

## 2016-07-17 ENCOUNTER — Ambulatory Visit (INDEPENDENT_AMBULATORY_CARE_PROVIDER_SITE_OTHER): Payer: 59 | Admitting: Orthopaedic Surgery

## 2016-07-17 VITALS — BP 142/87 | HR 87 | Temp 97.7°F | Ht 62.0 in | Wt 157.0 lb

## 2016-07-17 DIAGNOSIS — G8929 Other chronic pain: Secondary | ICD-10-CM

## 2016-07-17 DIAGNOSIS — F17210 Nicotine dependence, cigarettes, uncomplicated: Secondary | ICD-10-CM | POA: Diagnosis not present

## 2016-07-17 DIAGNOSIS — M5441 Lumbago with sciatica, right side: Secondary | ICD-10-CM

## 2016-07-17 MED ORDER — NAPROXEN 500 MG PO TABS: 500.0000 mg | ORAL_TABLET | Freq: Two times a day (BID) | ORAL | 5 refills | Status: DC

## 2016-07-17 NOTE — Progress Notes (Signed)
Patient Ellen Reynolds, female DOB:1950/09/01, 66 y.o. VZD:[id number]  Chief Complaint  Patient presents with  . New Patient (Initial Visit)    Back pain    HPI  Ellen Reynolds is a 66 y.o. female who has had chronic pain of the lumbar spine. She has done well until the last few months.  She has more and more pain of the lower back with right sided sciatica more at night and early in the mornings.  She has no trauma, no weakness, no bowel or bladder problems.  She has tried Aleve which helps, ice and heat and rest which help some.  She has Toradol which helps as well.  She is not improving. HPI  Body mass index is 28.72 kg/m.  ROS  Review of Systems  HENT: Negative for congestion.   Respiratory: Positive for shortness of breath. Negative for cough.   Cardiovascular: Negative for chest pain and leg swelling.  Endocrine: Negative for cold intolerance.  Musculoskeletal: Positive for arthralgias and back pain.  Allergic/Immunologic: Negative for environmental allergies.   Past Medical History:  Diagnosis Date  . Arthritis   . Chronic back pain   . COPD (chronic obstructive pulmonary disease) ([place])   . Generalized anxiety disorder   . Hyperlipidemia   . Insomnia   . Rectal bleeding     Past Surgical History:  Procedure Laterality Date  . COLONOSCOPY  01/22/2011   Procedure: COLONOSCOPY;  Surgeon: Jamesetta So;  [facility];  Service: Gastroenterology;  Laterality: N/A;  . DILATION AND CURETTAGE OF UTERUS      Current Outpatient Prescriptions on File Prior to Visit  Medication Sig Dispense Refill  . Aclidinium Bromide (TUDORZA PRESSAIR) 400 MCG/ACT AEPB Inhale into the lungs.    . calcium citrate-vitamin D (CITRACAL+D) 315-200 MG-UNIT per tablet Take 1 tablet by mouth 2 (two) times daily.    . cyclobenzaprine (FLEXERIL) 10 MG tablet Take 1 tablet (10 mg total) by mouth 3 (three) times daily. (Patient taking differently: Take 10 mg by mouth 3 (three) times  daily as needed. ) 90 tablet 3  . sodium phosphates (OSMOPREP) 1.102-0.398 G TABS Take 1 tablet by mouth once. 32 tablet 0   No current facility-administered medications on file prior to visit.     Social History   Social History  . Marital status: Married    Spouse name: N/A  . Number of children: N/A  . Years of education: N/A   Occupational History  . Not on file.   Social History Main Topics  . Smoking status: Current Every Day Smoker  . Smokeless tobacco: Never Used     Comment: 1 pack a day x 40 yrs  . Alcohol use No  . Drug use: No  . Sexual activity: Not on file   Other Topics Concern  . Not on file   Social History Narrative  . No narrative on file    Family History  Problem Relation Age of Onset  . Multiple myeloma Mother   . Cancer Mother   . Melanoma Father   . Diabetes Mellitus II Father   . Hypertension Father   . Cancer Father   . Colon cancer Maternal Aunt     BP (!) 142/87   Pulse 87   Temp 97.7 F (36.5 C)   Ht '5\' 2"'  (1.575 m)   Wt 157 lb (71.2 kg)   BMI 28.72 kg/m    Past Medical History:  Diagnosis Date  . Arthritis   .  Chronic back pain   . COPD (chronic obstructive pulmonary disease) ([place])   . Generalized anxiety disorder   . Hyperlipidemia   . Insomnia   . Rectal bleeding     Past Surgical History:  Procedure Laterality Date  . COLONOSCOPY  01/22/2011   Procedure: COLONOSCOPY;  Surgeon: Jamesetta So;  [facility];  Service: Gastroenterology;  Laterality: N/A;  . DILATION AND CURETTAGE OF UTERUS      Family History  Problem Relation Age of Onset  . Multiple myeloma Mother   . Cancer Mother   . Melanoma Father   . Diabetes Mellitus II Father   . Hypertension Father   . Cancer Father   . Colon cancer Maternal Aunt     Social History Social History  Substance Use Topics  . Smoking status: Current Every Day Smoker  . Smokeless tobacco: Never Used     Comment: 1 pack a day x 40 yrs  . Alcohol use No     Allergies  Allergen Reactions  . Codeine Nausea Only  . Macrodantin Hives  . Sulfa Antibiotics Hives    Current Outpatient Prescriptions  Medication Sig Dispense Refill  . Aclidinium Bromide (TUDORZA PRESSAIR) 400 MCG/ACT AEPB Inhale into the lungs.    . calcium citrate-vitamin D (CITRACAL+D) 315-200 MG-UNIT per tablet Take 1 tablet by mouth 2 (two) times daily.    . cyclobenzaprine (FLEXERIL) 10 MG tablet Take 1 tablet (10 mg total) by mouth 3 (three) times daily. (Patient taking differently: Take 10 mg by mouth 3 (three) times daily as needed. ) 90 tablet 3  . naproxen (NAPROSYN) 500 MG tablet Take 1 tablet (500 mg total) by mouth 2 (two) times daily with a meal. 60 tablet 5  . sodium phosphates (OSMOPREP) 1.102-0.398 G TABS Take 1 tablet by mouth once. 32 tablet 0   No current facility-administered medications for this visit.      Physical Exam  Blood pressure (!) 142/87, pulse 87, temperature 97.7 F (36.5 C), height '5\' 2"'  (1.575 m), weight 157 lb (71.2 kg).  Constitutional: overall normal hygiene, normal nutrition, well developed, normal grooming, normal body habitus. Assistive device:none  Musculoskeletal: gait and station Limp none, muscle tone and strength are normal, no tremors or atrophy is present.  .  Neurological: coordination overall normal.  Deep tendon reflex/nerve stretch intact.  Sensation normal.  Cranial nerves II-XII intact.   Skin:   Normal overall no scars, lesions, ulcers or rashes. No psoriasis.  Psychiatric: Alert and oriented x 3.  Recent memory intact, remote memory unclear.  Normal mood and affect. Well groomed.  Good eye contact.  Cardiovascular: overall no swelling, no varicosities, no edema bilaterally, normal temperatures of the legs and arms, no clubbing, cyanosis and good capillary refill.  Lymphatic: palpation is normal.  Spine/Pelvis examination:  Inspection:  Overall, sacoiliac joint benign and hips nontender; without crepitus or  defects.   Thoracic spine inspection: Alignment normal without kyphosis present   Lumbar spine inspection:  Alignment  with normal lumbar lordosis, with scoliosis apparent.   Thoracic spine palpation:  without tenderness of spinal processes   Lumbar spine palpation: with tenderness of lumbar area; without tightness of lumbar muscles    Range of Motion:   Lumbar flexion, forward flexion is 35 without pain or tenderness    Lumbar extension is 10 without pain or tenderness   Left lateral bend is Normal  without pain or tenderness   Right lateral bend is Normal without pain or  tenderness   Straight leg raising is Normal   Strength & tone: Normal   Stability overall normal stability   She continues to smoke.  She is willing to consider cutting back.  The patient has been educated about the nature of the problem(s) and counseled on treatment options.  The patient appeared to understand what I have discussed and is in agreement with it.  Encounter Diagnoses  Name Primary?  . Chronic right-sided low back pain with right-sided sciatica Yes  . Cigarette nicotine dependence without complication     PLAN Call if any problems.  Precautions discussed.  Continue current medications.   Return to clinic 1 month   I have told her to continue her Toradol. I will call in Naprosyn for her.  Electronically Signed Sanjuana Kava, MD 1/24/201811:35 AM

## 2016-07-17 NOTE — Patient Instructions (Signed)
Steps to Quit Smoking Smoking tobacco can be bad for your health. It can also affect almost every organ in your body. Smoking puts you and people around you at risk for many serious Elana Jian-lasting (chronic) diseases. Quitting smoking is hard, but it is one of the best things that you can do for your health. It is never too late to quit. What are the benefits of quitting smoking? When you quit smoking, you lower your risk for getting serious diseases and conditions. They can include:  Lung cancer or lung disease.  Heart disease.  Stroke.  Heart attack.  Not being able to have children (infertility).  Weak bones (osteoporosis) and broken bones (fractures). If you have coughing, wheezing, and shortness of breath, those symptoms may get better when you quit. You may also get sick less often. If you are pregnant, quitting smoking can help to lower your chances of having a baby of low birth weight. What can I do to help me quit smoking? Talk with your doctor about what can help you quit smoking. Some things you can do (strategies) include:  Quitting smoking totally, instead of slowly cutting back how much you smoke over a period of time.  Going to in-person counseling. You are more likely to quit if you go to many counseling sessions.  Using resources and support systems, such as:  Online chats with a counselor.  Phone quitlines.  Printed self-help materials.  Support groups or group counseling.  Text messaging programs.  Mobile phone apps or applications.  Taking medicines. Some of these medicines may have nicotine in them. If you are pregnant or breastfeeding, do not take any medicines to quit smoking unless your doctor says it is okay. Talk with your doctor about counseling or other things that can help you. Talk with your doctor about using more than one strategy at the same time, such as taking medicines while you are also going to in-person counseling. This can help make quitting  easier. What things can I do to make it easier to quit? Quitting smoking might feel very hard at first, but there is a lot that you can do to make it easier. Take these steps:  Talk to your family and friends. Ask them to support and encourage you.  Call phone quitlines, reach out to support groups, or work with a counselor.  Ask people who smoke to not smoke around you.  Avoid places that make you want (trigger) to smoke, such as:  Bars.  Parties.  Smoke-break areas at work.  Spend time with people who do not smoke.  Lower the stress in your life. Stress can make you want to smoke. Try these things to help your stress:  Getting regular exercise.  Deep-breathing exercises.  Yoga.  Meditating.  Doing a body scan. To do this, close your eyes, focus on one area of your body at a time from head to toe, and notice which parts of your body are tense. Try to relax the muscles in those areas.  Download or buy apps on your mobile phone or tablet that can help you stick to your quit plan. There are many free apps, such as QuitGuide from the CDC (Centers for Disease Control and Prevention). You can find more support from smokefree.gov and other websites. This information is not intended to replace advice given to you by your health care provider. Make sure you discuss any questions you have with your health care provider. Document Released: 04/06/2009 Document Revised: 02/06/2016 Document   Reviewed: 10/25/2014 Elsevier Interactive Patient Education  2017 Elsevier Inc.  

## 2016-08-08 ENCOUNTER — Telehealth: Payer: Self-pay | Admitting: Orthopaedic Surgery

## 2016-08-08 NOTE — Telephone Encounter (Signed)
Patient is wanting to know if she can use her MRI from 04/13/15 instead of having another before you can send her to Dr. Channing Muttersoy. She said that she can not afford another MRI.  Please advise

## 2016-08-08 NOTE — Telephone Encounter (Signed)
I think it should be alright.  Check with his office.

## 2016-08-09 ENCOUNTER — Telehealth: Payer: Self-pay | Admitting: Orthopaedic Surgery

## 2016-08-09 NOTE — Telephone Encounter (Signed)
Patient found out that Dr. Channing Muttersoy would need a more recent MRI(6 months or less). She is asking does she need to keep her appointment on Thursday with you or could you go ahead and order the MRI. She said it costed her so much for her last visit, because her insurance didn't pay that she just couldn't pay for all these appointments.  Please advise

## 2016-08-13 NOTE — Telephone Encounter (Signed)
[ ]   Order MRI

## 2016-08-15 ENCOUNTER — Ambulatory Visit: Payer: 59 | Admitting: Orthopaedic Surgery

## 2016-08-21 ENCOUNTER — Other Ambulatory Visit: Payer: Self-pay | Admitting: Radiology

## 2016-08-21 DIAGNOSIS — G8929 Other chronic pain: Secondary | ICD-10-CM

## 2016-08-21 DIAGNOSIS — M5441 Lumbago with sciatica, right side: Principal | ICD-10-CM

## 2016-09-05 ENCOUNTER — Telehealth: Payer: Self-pay | Admitting: Orthopaedic Surgery

## 2016-09-05 NOTE — Telephone Encounter (Signed)
Please send referral to Dr. Channing Muttersoy for this patient.  Thanks

## 2016-09-05 NOTE — Telephone Encounter (Signed)
Patient wants to know the results of her MRI.  Per Dr. Sanjuan DameKeeling's, pt was told that she has no big changes from 2016, she has arthritis and scoliosis.  Dr. Hilda LiasKeeling also said that he doesn't think she needs surgery.   Patient wants our office to get her an appointment with Dr. Channing Muttersoy. I told her that I would ask Lenis NoonGlynda Sackfield who does Dr. Sanjuan DameKeeling's referrals to do this for her.

## 2016-09-10 NOTE — Telephone Encounter (Signed)
Per verbal order from Dr. Hilda LiasKeeling, I sent the referral to Dr. Channing Muttersoy.

## 2016-09-16 ENCOUNTER — Telehealth: Payer: Self-pay | Admitting: Orthopaedic Surgery

## 2016-09-16 NOTE — Telephone Encounter (Signed)
Patient called to let you know that she was contacted by Dr. Temple Pacinioy's office and was informed by the lady on the phone that Dr. Channing Muttersoy only see surgical patients. She was also told that Dr. Channing Muttersoy was retiring in April of 2019. She stated that the lady told her that maybe she needed to see a pain clinic. Patient said that she would appreciate if you would continue to prescribe her medication and would come in to see you as needed.

## 2016-10-02 ENCOUNTER — Telehealth: Payer: Self-pay | Admitting: Orthopaedic Surgery

## 2016-11-05 ENCOUNTER — Telehealth: Payer: Self-pay | Admitting: Orthopaedic Surgery

## 2016-11-05 MED ORDER — PREDNISONE 5 MG (21) PO TBPK: ORAL_TABLET | ORAL | 0 refills | Status: DC

## 2016-11-05 NOTE — Telephone Encounter (Signed)
Patient requests that you send in [facility] for [facility].  Thanks

## 2017-03-26 ENCOUNTER — Telehealth: Payer: Self-pay | Admitting: Orthopaedic Surgery

## 2017-03-26 NOTE — Telephone Encounter (Signed)
Tramadol 50 mg Qty 60 Tablets  Please fax to [facility]

## 2017-03-27 MED ORDER — TRAMADOL HCL 50 MG PO TABS: ORAL_TABLET | ORAL | 2 refills | Status: DC

## 2017-04-17 ENCOUNTER — Telehealth: Payer: Self-pay | Admitting: Orthopaedic Surgery

## 2017-04-17 MED ORDER — PREDNISONE 5 MG (21) PO TBPK: ORAL_TABLET | ORAL | 0 refills | Status: DC

## 2017-04-17 NOTE — Telephone Encounter (Signed)
Patient is asking if you would please call her in a steroid prescription for her back. States she has been in constant pain with her back now for about 5/6 days and she knows the steroid would help.  Her pharmacy is [facility].  Please advise

## 2017-05-19 ENCOUNTER — Other Ambulatory Visit (HOSPITAL_COMMUNITY): Payer: Self-pay | Admitting: Internal Medicine

## 2017-05-19 DIAGNOSIS — Z1231 Encounter for screening mammogram for malignant neoplasm of breast: Secondary | ICD-10-CM

## 2017-06-23 ENCOUNTER — Ambulatory Visit (HOSPITAL_COMMUNITY)
Admission: RE | Admit: 2017-06-23 | Discharge: 2017-06-23 | Disposition: A | Discharge status: Home / Self Care | Payer: 59 | Source: Ambulatory Visit | Attending: Internal Medicine | Admitting: Internal Medicine

## 2017-06-23 DIAGNOSIS — Z1231 Encounter for screening mammogram for malignant neoplasm of breast: Secondary | ICD-10-CM | POA: Insufficient documentation

## 2017-08-09 ENCOUNTER — Other Ambulatory Visit: Payer: Self-pay | Admitting: Orthopaedic Surgery

## 2017-08-21 ENCOUNTER — Telehealth: Payer: Self-pay | Admitting: Orthopaedic Surgery

## 2017-08-21 MED ORDER — PREDNISONE 5 MG (21) PO TBPK: ORAL_TABLET | ORAL | 0 refills | Status: DC

## 2017-08-21 NOTE — Telephone Encounter (Signed)
Patient requests refill on Prednisone 5 mgs.  Qty  21  Sig: Take 6 pills first day; 5 pills second day; 4 pills third day; 3 pills fourth day; 2 pills next day and 1 pill last day.  PATIENT STATES SHE USES [facility]

## 2018-01-06 ENCOUNTER — Other Ambulatory Visit: Payer: Self-pay | Admitting: Orthopaedic Surgery

## 2018-01-16 ENCOUNTER — Encounter (INDEPENDENT_AMBULATORY_CARE_PROVIDER_SITE_OTHER): Payer: Self-pay | Admitting: *Deleted

## 2018-06-11 ENCOUNTER — Telehealth: Payer: Self-pay | Admitting: Orthopaedic Surgery

## 2018-06-11 MED ORDER — PREDNISONE 5 MG (21) PO TBPK: ORAL_TABLET | ORAL | 0 refills | Status: DC

## 2018-06-11 NOTE — Telephone Encounter (Signed)
Patient requests refill on Prednisone 21 tablets  Sig: Take 6 pills first day; 5 pills second day; 4 pills third day; 3 pills fourth day; 2 pills next day and 1 pill last day.  Patient states she uses [facility]

## 2018-07-06 ENCOUNTER — Other Ambulatory Visit (HOSPITAL_COMMUNITY): Payer: Self-pay | Admitting: Internal Medicine

## 2018-07-06 DIAGNOSIS — Z1231 Encounter for screening mammogram for malignant neoplasm of breast: Secondary | ICD-10-CM

## 2018-07-15 ENCOUNTER — Ambulatory Visit (HOSPITAL_COMMUNITY)
Admission: RE | Admit: 2018-07-15 | Discharge: 2018-07-15 | Disposition: A | Discharge status: Home / Self Care | Payer: 59 | Source: Ambulatory Visit | Attending: Internal Medicine | Admitting: Internal Medicine

## 2018-07-15 DIAGNOSIS — Z1231 Encounter for screening mammogram for malignant neoplasm of breast: Secondary | ICD-10-CM | POA: Diagnosis present

## 2018-07-22 ENCOUNTER — Other Ambulatory Visit: Payer: Self-pay | Admitting: Orthopaedic Surgery

## 2018-09-17 ENCOUNTER — Other Ambulatory Visit: Payer: Self-pay | Admitting: Orthopaedic Surgery

## 2018-11-13 ENCOUNTER — Other Ambulatory Visit: Payer: Self-pay | Admitting: Orthopaedic Surgery

## 2018-11-18 ENCOUNTER — Encounter: Payer: Self-pay | Admitting: Orthopaedic Surgery

## 2018-11-18 ENCOUNTER — Ambulatory Visit (INDEPENDENT_AMBULATORY_CARE_PROVIDER_SITE_OTHER): Payer: 59 | Admitting: Orthopaedic Surgery

## 2018-11-18 ENCOUNTER — Other Ambulatory Visit: Payer: Self-pay

## 2018-11-18 DIAGNOSIS — G8929 Other chronic pain: Secondary | ICD-10-CM

## 2018-11-18 DIAGNOSIS — M545 Low back pain: Secondary | ICD-10-CM | POA: Diagnosis not present

## 2018-11-18 MED ORDER — TRAMADOL HCL 50 MG PO TABS: 50.0000 mg | ORAL_TABLET | Freq: Four times a day (QID) | ORAL | 4 refills | Status: DC | PRN

## 2018-11-18 NOTE — Progress Notes (Signed)
Virtual Visit via Telephone Note  I connected with@ on 11/18/18 at  9:30 AM EDT by telephone and verified that I am speaking with the correct person using two identifiers.  Location: Patient: home Provider: home   I discussed the limitations, risks, security and privacy concerns of performing an evaluation and management service by telephone and the availability of in person appointments. I also discussed with the patient that there may be a patient responsible charge related to this service. The patient expressed understanding and agreed to proceed.   History of Present Illness: She has chronic lower back pain with no radiation of pain. She has good and bad days. She has no weakness, no numbness, no trauma. She is out of her Tramadol.   Observations/Objective: Per above.  Assessment and Plan: Encounter Diagnosis  Name Primary?  . Chronic midline low back pain without sciatica Yes     Follow Up Instructions: 3 months.  I have reviewed the [place] Controlled Substance Reporting System web site prior to prescribing narcotic medicine for this patient.      I discussed the assessment and treatment plan with the patient. The patient was provided an opportunity to ask questions and all were answered. The patient agreed with the plan and demonstrated an understanding of the instructions.   The patient was advised to call back or seek an in-person evaluation if the symptoms worsen or if the condition fails to improve as anticipated.  I provided 6 minutes of non-face-to-face time during this encounter.   Darreld Mclean, MD

## 2018-11-26 ENCOUNTER — Telehealth: Payer: Self-pay | Admitting: Orthopaedic Surgery

## 2018-11-26 MED ORDER — PREDNISONE 10 MG (21) PO TBPK: ORAL_TABLET | ORAL | 1 refills | Status: DC

## 2018-11-26 NOTE — Telephone Encounter (Signed)
Patient is asking if you could prescribe her Prednisone a little stronger than last time which was 5 mg.  PATIENT USES [facility]

## 2019-02-17 ENCOUNTER — Ambulatory Visit: Payer: 59 | Admitting: Orthopaedic Surgery

## 2019-03-03 ENCOUNTER — Ambulatory Visit (INDEPENDENT_AMBULATORY_CARE_PROVIDER_SITE_OTHER): Payer: 59 | Admitting: Orthopaedic Surgery

## 2019-03-03 ENCOUNTER — Other Ambulatory Visit: Payer: Self-pay

## 2019-03-03 ENCOUNTER — Encounter: Payer: Self-pay | Admitting: Orthopaedic Surgery

## 2019-03-03 DIAGNOSIS — G8929 Other chronic pain: Secondary | ICD-10-CM | POA: Diagnosis not present

## 2019-03-03 DIAGNOSIS — M545 Low back pain: Secondary | ICD-10-CM | POA: Diagnosis not present

## 2019-03-03 DIAGNOSIS — M25551 Pain in right hip: Secondary | ICD-10-CM | POA: Diagnosis not present

## 2019-03-03 NOTE — Progress Notes (Signed)
Virtual Visit via Telephone Note  I connected with@ on 03/03/19 at  9:10 AM EDT by telephone and verified that I am speaking with the correct person using two identifiers.  Location: Patient: home Provider: home   I discussed the limitations, risks, security and privacy concerns of performing an evaluation and management service by telephone and the availability of in person appointments. I also discussed with the patient that there may be a patient responsible charge related to this service. The patient expressed understanding and agreed to proceed.   History of Present Illness: She has the chronic midline back pain and has good and bad days with that.  She has no numbness, no new trauma.  She has developed pain in the right hip.  She has pain with rotation of the hip.  It hurts at night and wakes her up sometimes.  She cannot get comfortable some days.  I told her she will need X-rays of the hip.  She is very concerned about coming to the office and be exposed to other people.  I will arrange for her to come one afternoon when I have no patients and she can get the x-rays then as there will be only staff in the office.  She is very agreeable to this.  I can review x-rays the following day.  She has her medication and does not need refills at this time.     Observations/Objective: Per above.  Assessment and Plan: Encounter Diagnoses  Name Primary?  . Chronic midline low back pain without sciatica Yes  . Right hip pain      Follow Up Instructions: Three months for regular visit by telephone.  Set up x-rays of right hip and lumbar spine as discussed above.   I discussed the assessment and treatment plan with the patient. The patient was provided an opportunity to ask questions and all were answered. The patient agreed with the plan and demonstrated an understanding of the instructions.   The patient was advised to call back or seek an in-person evaluation if the symptoms worsen  or if the condition fails to improve as anticipated.  I provided 11 minutes of non-face-to-face time during this encounter.   Sanjuana Kava, MD

## 2019-03-16 ENCOUNTER — Telehealth: Payer: Self-pay

## 2019-03-16 DIAGNOSIS — M25551 Pain in right hip: Secondary | ICD-10-CM

## 2019-03-16 NOTE — Telephone Encounter (Signed)
Ellen Reynolds, patient called to schedule her R Hip xray here in the office. The order has not been put in and she wants to come in on Tuesday or Thursday of next week to have it done. Dr. Luna Glasgow told her to call and schedule it when she was ready to have it done. Will you please put in the order for this?  Thanks

## 2019-03-16 NOTE — Telephone Encounter (Signed)
Dr Luna Glasgow note does state set up x-rays of right hip and lumbar spine as discussed above.  She will need an office visit for xray only, then once the appointment is in system we can put in the xray orders.   Can you make her an appointment for xray only? Or we can just make it when she arrives, then put in the orders.

## 2019-03-16 NOTE — Telephone Encounter (Signed)
I have pended the orders for 03/23/2019 and will advise Renee of the appointment

## 2019-03-23 ENCOUNTER — Other Ambulatory Visit: Payer: Self-pay

## 2019-03-23 ENCOUNTER — Ambulatory Visit: Payer: 59

## 2019-03-23 ENCOUNTER — Encounter: Payer: 59 | Admitting: Orthopaedic Surgery

## 2019-03-23 ENCOUNTER — Ambulatory Visit (INDEPENDENT_AMBULATORY_CARE_PROVIDER_SITE_OTHER): Payer: 59

## 2019-03-23 DIAGNOSIS — M25551 Pain in right hip: Secondary | ICD-10-CM

## 2019-03-23 DIAGNOSIS — G8929 Other chronic pain: Secondary | ICD-10-CM

## 2019-03-23 DIAGNOSIS — M545 Low back pain, unspecified: Secondary | ICD-10-CM

## 2019-04-07 ENCOUNTER — Telehealth: Payer: Self-pay | Admitting: Orthopaedic Surgery

## 2019-04-07 DIAGNOSIS — M87052 Idiopathic aseptic necrosis of left femur: Secondary | ICD-10-CM

## 2019-04-07 NOTE — Telephone Encounter (Signed)
Ellen Reynolds asks that you call her at home with the results of the xrays that she had done.  Thanks

## 2019-04-08 NOTE — Telephone Encounter (Signed)
She needs MRI of the hip on the left.  She may have early avascular necrosis.  She has marked arthritis of the lumbar spine.

## 2019-04-08 NOTE — Telephone Encounter (Signed)
Confirmed with Dr Luna Glasgow MRI is needed for left hip. Put in order.

## 2019-04-12 NOTE — Telephone Encounter (Signed)
X-ray shows normal left hip,   sclerosis and cyst in the femoral head right hip may suggest some evidence of avascular necrosis although the hip is in normal contour and joint space narrowing none.  abnormalities are in the right hip

## 2019-04-12 NOTE — Telephone Encounter (Signed)
So , the xrays were ordered by Dr Luna Glasgow, but were put in under your name so you read the films, your report states X-ray shows normal left hip sclerosis and cyst in the femoral head right hip may suggest some evidence of avascular necrosis although the hip is in normal contour and joint space narrowing none.  Impression subtle abnormalities in the femoral head compared right to left could represent avascular necrosis depending on clinical symptoms  So Dr Luna Glasgow ordered MRI left hip. Patient denies any left hip pain, want you to make sure the left hip is correct not the right, since she has no left hip pain.

## 2019-04-12 NOTE — Telephone Encounter (Signed)
Thanks for looking back at this for me. I will correct in the MRI order, but will advise Dr Luna Glasgow also.

## 2019-04-12 NOTE — Telephone Encounter (Signed)
What?

## 2019-04-12 NOTE — Telephone Encounter (Signed)
The xrays were signed as DR Aline Brochure not Dr Luna Glasgow, I spoke to patient and her right hip is painful, can you look at them again and verify the report, please?

## 2019-04-12 NOTE — Addendum Note (Signed)
Addended byCandice Camp on: 04/12/2019 04:14 PM   Modules accepted: Orders

## 2019-04-13 ENCOUNTER — Telehealth: Payer: Self-pay | Admitting: Orthopaedic Surgery

## 2019-04-13 DIAGNOSIS — M87051 Idiopathic aseptic necrosis of right femur: Secondary | ICD-10-CM

## 2019-04-13 NOTE — Telephone Encounter (Signed)
She said a lady from [facility] called her yesterday regarding MRI.  She said she thought it was understood that she needed an open unit. She has canceled that MRI.  She wants to go to [facility]).  Could you get approval for open unit and schedule this for her?  Thanks

## 2019-04-13 NOTE — Telephone Encounter (Signed)
Thanks

## 2019-04-13 NOTE — Telephone Encounter (Addendum)
Patient prefers [facility] have sent order Told her she will have to get a disc and she will have to get them to fax [place] a report she has voiced understandigng

## 2019-04-14 ENCOUNTER — Telehealth: Payer: Self-pay | Admitting: Orthopaedic Surgery

## 2019-04-14 NOTE — Telephone Encounter (Signed)
I sent it yesterday, thanks

## 2019-04-14 NOTE — Telephone Encounter (Signed)
Could you resend orders for MRI for this patient to [facility] [place] please and then call her once this is done.  Thanks so much  Phone #    [phone] Fax #        [phone]

## 2019-04-15 ENCOUNTER — Ambulatory Visit (HOSPITAL_COMMUNITY): Payer: 59

## 2019-04-27 ENCOUNTER — Encounter: Payer: Self-pay | Admitting: Orthopaedic Surgery

## 2019-04-27 ENCOUNTER — Ambulatory Visit (INDEPENDENT_AMBULATORY_CARE_PROVIDER_SITE_OTHER): Payer: 59 | Admitting: Orthopaedic Surgery

## 2019-04-27 ENCOUNTER — Other Ambulatory Visit: Payer: Self-pay

## 2019-04-27 ENCOUNTER — Telehealth: Payer: Self-pay | Admitting: Orthopaedic Surgery

## 2019-04-27 DIAGNOSIS — M545 Low back pain: Secondary | ICD-10-CM | POA: Diagnosis not present

## 2019-04-27 DIAGNOSIS — M25551 Pain in right hip: Secondary | ICD-10-CM | POA: Diagnosis not present

## 2019-04-27 DIAGNOSIS — G8929 Other chronic pain: Secondary | ICD-10-CM

## 2019-04-27 MED ORDER — PREDNISONE 10 MG (21) PO TBPK: ORAL_TABLET | ORAL | 1 refills | Status: DC

## 2019-04-27 NOTE — Telephone Encounter (Signed)
Copy of report provided to Dr Luna Glasgow to give results - given to patient per phone visit today 04/27/19.

## 2019-04-27 NOTE — Telephone Encounter (Signed)
Give results over the phone if she wants.  I have not seen the report.

## 2019-04-27 NOTE — Telephone Encounter (Signed)
Patient's MRI report from [place] has been received. Based on current schedule, and out of clinic dates, may patient receive results by phone visit? If so, today, or tomorrow?

## 2019-04-27 NOTE — Progress Notes (Signed)
Virtual Visit via Telephone Note  I connected with@ on 04/27/19 at  3:00 PM EST by telephone and verified that I am speaking with the correct person using two identifiers.  Location: Patient: home Provider: [place]   I discussed the limitations, risks, security and privacy concerns of performing an evaluation and management service by telephone and the availability of in person appointments. I also discussed with the patient that there may be a patient responsible charge related to this service. The patient expressed understanding and agreed to proceed.   History of Present Illness: She had had long history of lower back pain and more recently right hip pain.  The hip has gotten worse and worse. She has no trauma.  She had MRI of the hip done 04-21-2019 at [facility].  The results were negative for fracture, reactive edema vs mild bursitis of the right trochanteric bursa, mild tenosynovitis of insertion of the right gluteus minimus, tear of base of the anterior labrum on the right with degenerative labral changes, severe degenerative disc disease of L3-L4 with moderate degenerative facet changes of the right lower lumbar spine and partial fusion of the L4-L5 disc space with scoliosis.    I have informed her of the results. She declines epidural injection.  She will continue with her Ultram.  I will renew the prednisone dose pack, 10 mgm   Observations/Objective: Per above.  Assessment and Plan: Encounter Diagnoses  Name Primary?  . Chronic midline low back pain without sciatica Yes  . Right hip pain      Follow Up Instructions: I will call in prednisone. Continue the Ultram.  I will see as needed.   I discussed the assessment and treatment plan with the patient. The patient was provided an opportunity to ask questions and all were answered. The patient agreed with the plan and demonstrated an understanding of the instructions.   The patient was advised to call back or seek an  in-person evaluation if the symptoms worsen or if the condition fails to improve as anticipated.  I provided 12 minutes of non-face-to-face time during this encounter.   Sanjuana Kava, MD

## 2019-07-13 ENCOUNTER — Telehealth: Payer: Self-pay

## 2019-07-13 NOTE — Telephone Encounter (Signed)
Tramadol HCL 50 MG  Qty 60 Tablets  PATIENT USES [facility]

## 2019-07-14 MED ORDER — TRAMADOL HCL 50 MG PO TABS: 50.0000 mg | ORAL_TABLET | Freq: Four times a day (QID) | ORAL | 4 refills | Status: DC | PRN

## 2020-02-08 ENCOUNTER — Telehealth: Payer: Self-pay | Admitting: Orthopaedic Surgery

## 2020-02-08 NOTE — Telephone Encounter (Signed)
Patient called to request refill * last office visit 04/27/19  Medication:  traMADol (ULTRAM) 50 MG tablet 60 tablet 4 07/14/2019    Sig - Route: Take 1 tablet (50 mg total) by mouth every 6 (six) hours as needed. One tablet every six hours as needed for pain.     [facility]

## 2020-02-08 NOTE — Telephone Encounter (Signed)
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

## 2020-02-10 ENCOUNTER — Encounter: Payer: Self-pay | Admitting: Orthopaedic Surgery

## 2020-02-10 ENCOUNTER — Other Ambulatory Visit: Payer: Self-pay

## 2020-02-10 ENCOUNTER — Ambulatory Visit (INDEPENDENT_AMBULATORY_CARE_PROVIDER_SITE_OTHER): Payer: 59 | Admitting: Orthopaedic Surgery

## 2020-02-10 VITALS — BP 130/89 | HR 96 | Ht 62.0 in | Wt 155.0 lb

## 2020-02-10 DIAGNOSIS — M545 Low back pain: Secondary | ICD-10-CM | POA: Diagnosis not present

## 2020-02-10 DIAGNOSIS — G8929 Other chronic pain: Secondary | ICD-10-CM

## 2020-02-10 MED ORDER — TRAMADOL HCL 50 MG PO TABS
50.0000 mg | ORAL_TABLET | Freq: Four times a day (QID) | ORAL | 4 refills | Status: DC | PRN
Start: 2020-02-10 — End: 2020-08-10

## 2020-02-10 NOTE — Progress Notes (Signed)
Patient Ellen Reynolds, female DOB:1951/04/25, 69 y.o. JEH:[id number]  Chief Complaint  Patient presents with  . Back Pain  . Hip Pain  . Medication Refill    HPI  Ellen Reynolds is a 69 y.o. female who has chronic lower back pain and hip pain.  She has no new trauma. She is retired now but still has the pain.  She has good and bad days.  She has no weakness.   Body mass index is 28.35 kg/m.  ROS  Review of Systems  Constitutional: Positive for activity change.  Respiratory: Positive for shortness of breath.   Musculoskeletal: Positive for arthralgias and back pain.  All other systems reviewed and are negative.   All other systems reviewed and are negative.  The following is a summary of the past history medically, past history surgically, known current medicines, social history and family history.  This information is gathered electronically by the computer from prior information and documentation.  I review this each visit and have found including this information at this point in the chart is beneficial and informative.    Past Medical History:  Diagnosis Date  . Arthritis   . Chronic back pain   . COPD (chronic obstructive pulmonary disease) ([place])   . Generalized anxiety disorder   . Hyperlipidemia   . Insomnia   . Rectal bleeding     Past Surgical History:  Procedure Laterality Date  . COLONOSCOPY  01/22/2011   Procedure: COLONOSCOPY;  Surgeon: Jamesetta So;  Location: [facility];  Service: Gastroenterology;  Laterality: N/A;  . DILATION AND CURETTAGE OF UTERUS      Family History  Problem Relation Age of Onset  . Multiple myeloma Mother   . Cancer Mother   . Melanoma Father   . Diabetes Mellitus II Father   . Hypertension Father   . Cancer Father   . Colon cancer Maternal Aunt     Social History Social History   Tobacco Use  . Smoking status: Current Every Day Smoker  . Smokeless tobacco: Never Used  . Tobacco comment: 1 pack a day x 40  yrs  Substance Use Topics  . Alcohol use: No  . Drug use: No    Allergies  Allergen Reactions  . Codeine Nausea Only  . Macrodantin Hives  . Sulfa Antibiotics Hives    Current Outpatient Medications  Medication Sig Dispense Refill  . Aclidinium Bromide (TUDORZA PRESSAIR) 400 MCG/ACT AEPB Inhale into the lungs.    . calcium citrate-vitamin D (CITRACAL+D) 315-200 MG-UNIT per tablet Take 1 tablet by mouth 2 (two) times daily.    . sodium phosphates (OSMOPREP) 1.102-0.398 G TABS Take 1 tablet by mouth once. 32 tablet 0  . traMADol (ULTRAM) 50 MG tablet Take 1 tablet (50 mg total) by mouth every 6 (six) hours as needed. One tablet every six hours as needed for pain. 60 tablet 4  . cyclobenzaprine (FLEXERIL) 10 MG tablet Take 1 tablet (10 mg total) by mouth 3 (three) times daily. (Patient not taking: Reported on 02/10/2020) 90 tablet 3  . naproxen (NAPROSYN) 500 MG tablet Take 1 tablet (500 mg total) by mouth 2 (two) times daily with a meal. (Patient not taking: Reported on 02/10/2020) 60 tablet 5  . predniSONE (STERAPRED UNI-PAK 21 TAB) 10 MG (21) TBPK tablet Take six pills the first day;5 pills the next day;4 pills the next day;3 pills the next day; 2 pills the next day,one the final day. (Patient not taking: Reported  on 02/10/2020) 21 tablet 1   No current facility-administered medications for this visit.     Physical Exam  Blood pressure 130/89, pulse 96, height '5\' 2"'  (1.575 m), weight 155 lb (70.3 kg).  Constitutional: overall normal hygiene, normal nutrition, well developed, normal grooming, normal body habitus. Assistive device:none  Musculoskeletal: gait and station Limp none, muscle tone and strength are normal, no tremors or atrophy is present.  .  Neurological: coordination overall normal.  Deep tendon reflex/nerve stretch intact.  Sensation normal.  Cranial nerves II-XII intact.   Skin:   Normal overall no scars, lesions, ulcers or rashes. No psoriasis.  Psychiatric: Alert  and oriented x 3.  Recent memory intact, remote memory unclear.  Normal mood and affect. Well groomed.  Good eye contact.  Cardiovascular: overall no swelling, no varicosities, no edema bilaterally, normal temperatures of the legs and arms, no clubbing, cyanosis and good capillary refill.  Lymphatic: palpation is normal.  Spine/Pelvis examination:  Inspection:  Overall, sacoiliac joint benign and hips nontender; without crepitus or defects.   Thoracic spine inspection: Alignment normal without kyphosis present   Lumbar spine inspection:  Alignment  with normal lumbar lordosis, without scoliosis apparent.   Thoracic spine palpation:  without tenderness of spinal processes   Lumbar spine palpation: without tenderness of lumbar area; without tightness of lumbar muscles    Range of Motion:   Lumbar flexion, forward flexion is normal without pain or tenderness    Lumbar extension is full without pain or tenderness   Left lateral bend is normal without pain or tenderness   Right lateral bend is normal without pain or tenderness   Straight leg raising is normal  Strength & tone: normal   Stability overall normal stability  All other systems reviewed and are negative   The patient has been educated about the nature of the problem(s) and counseled on treatment options.  The patient appeared to understand what I have discussed and is in agreement with it.  Encounter Diagnosis  Name Primary?  . Chronic midline low back pain without sciatica Yes    PLAN Call if any problems.  Precautions discussed.  Continue current medications.   Return to clinic 3 months   I have reviewed the [place] web site prior to prescribing narcotic medicine for this patient.

## 2020-05-11 ENCOUNTER — Encounter: Payer: Self-pay | Admitting: Orthopaedic Surgery

## 2020-05-11 ENCOUNTER — Other Ambulatory Visit: Payer: Self-pay

## 2020-05-11 ENCOUNTER — Ambulatory Visit (INDEPENDENT_AMBULATORY_CARE_PROVIDER_SITE_OTHER): Payer: 59 | Admitting: Orthopaedic Surgery

## 2020-05-11 DIAGNOSIS — M545 Low back pain, unspecified: Secondary | ICD-10-CM | POA: Diagnosis not present

## 2020-05-11 DIAGNOSIS — G8929 Other chronic pain: Secondary | ICD-10-CM

## 2020-05-11 NOTE — Progress Notes (Signed)
Virtual Visit via Telephone Note  I connected with@ on 05/11/20 at 10:30 AM EST by telephone and verified that I am speaking with the correct person using two identifiers.  Location: Patient: home Provider: office   I discussed the limitations, risks, security and privacy concerns of performing an evaluation and management service by telephone and the availability of in person appointments. I also discussed with the patient that there may be a patient responsible charge related to this service. The patient expressed understanding and agreed to proceed.   History of Present Illness: She has lower back pain.  She has good and bad days, more bad days with the cold weather.  She has no new trauma, no weakness.  She is taking her medicine.   Observations/Objective: Per above.  Assessment and Plan: Encounter Diagnosis  Name Primary?  . Chronic midline low back pain without sciatica Yes     Follow Up Instructions: Three months. Continue medicine.  Call if need refill.    I discussed the assessment and treatment plan with the patient. The patient was provided an opportunity to ask questions and all were answered. The patient agreed with the plan and demonstrated an understanding of the instructions.   The patient was advised to call back or seek an in-person evaluation if the symptoms worsen or if the condition fails to improve as anticipated.  I provided 8 minutes of non-face-to-face time during this encounter.   Darreld Mclean, MD

## 2020-05-29 ENCOUNTER — Other Ambulatory Visit (HOSPITAL_COMMUNITY): Payer: Self-pay | Admitting: Internal Medicine

## 2020-05-29 DIAGNOSIS — Z1231 Encounter for screening mammogram for malignant neoplasm of breast: Secondary | ICD-10-CM

## 2020-05-31 ENCOUNTER — Ambulatory Visit (HOSPITAL_COMMUNITY)
Admission: RE | Admit: 2020-05-31 | Discharge: 2020-05-31 | Disposition: A | Discharge status: Home / Self Care | Payer: 59 | Source: Ambulatory Visit | Attending: Internal Medicine | Admitting: Internal Medicine

## 2020-05-31 ENCOUNTER — Ambulatory Visit (HOSPITAL_COMMUNITY): Payer: 59

## 2020-05-31 ENCOUNTER — Other Ambulatory Visit: Payer: Self-pay

## 2020-05-31 DIAGNOSIS — Z1231 Encounter for screening mammogram for malignant neoplasm of breast: Secondary | ICD-10-CM | POA: Diagnosis present

## 2020-06-27 ENCOUNTER — Other Ambulatory Visit: Payer: Self-pay

## 2020-06-27 ENCOUNTER — Encounter: Payer: Self-pay | Admitting: Orthopaedic Surgery

## 2020-06-27 ENCOUNTER — Ambulatory Visit (INDEPENDENT_AMBULATORY_CARE_PROVIDER_SITE_OTHER): Payer: 59 | Admitting: Orthopaedic Surgery

## 2020-06-27 VITALS — BP 134/97 | HR 108 | Ht 62.0 in | Wt 148.0 lb

## 2020-06-27 DIAGNOSIS — M79671 Pain in right foot: Secondary | ICD-10-CM | POA: Diagnosis not present

## 2020-06-27 DIAGNOSIS — G8929 Other chronic pain: Secondary | ICD-10-CM | POA: Diagnosis not present

## 2020-06-27 NOTE — Progress Notes (Signed)
Patient Ellen Reynolds, female DOB:1950-09-25, 70 y.o. [id number]  Chief Complaint  Patient presents with  . Foot Pain    Rt foot pain on and off gotten worse over past 5-6 months. Seen by Dr. Caryl Comes who did an x-ray, no fracture but he saw plantar fascitis. Received of a shot     HPI  Ellen Reynolds is a 70 y.o. female who has chronic right heel pain in the mid of the plantar heel.  She has no trauma.  This has been bothering her for more than three months. She has iced it, used water bottle, elevated it, changed shoes.  She has no trauma.   Body mass index is 27.07 kg/m.  ROS  Review of Systems  Constitutional: Positive for activity change.  Respiratory: Positive for shortness of breath.   Musculoskeletal: Positive for arthralgias and back pain.  All other systems reviewed and are negative.   All other systems reviewed and are negative.  The following is a summary of the past history medically, past history surgically, known current medicines, social history and family history.  This information is gathered electronically by the computer from prior information and documentation.  I review this each visit and have found including this information at this point in the chart is beneficial and informative.    Past Medical History:  Diagnosis Date  . Arthritis   . Chronic back pain   . COPD (chronic obstructive pulmonary disease) ([place])   . Generalized anxiety disorder   . Hyperlipidemia   . Insomnia   . Rectal bleeding     Past Surgical History:  Procedure Laterality Date  . COLONOSCOPY  01/22/2011   Procedure: COLONOSCOPY;  Surgeon: Jamesetta So;  [facility];  Service: Gastroenterology;  Laterality: N/A;  . DILATION AND CURETTAGE OF UTERUS      Family History  Problem Relation Age of Onset  . Multiple myeloma Mother   . Cancer Mother   . Melanoma Father   . Diabetes Mellitus II Father   . Hypertension Father   . Cancer Father   . Colon cancer  Maternal Aunt     Social History Social History   Tobacco Use  . Smoking status: Current Every Day Smoker  . Smokeless tobacco: Never Used  . Tobacco comment: 1 pack a day x 40 yrs  Substance Use Topics  . Alcohol use: No  . Drug use: No    Allergies  Allergen Reactions  . Codeine Nausea Only  . Macrodantin Hives  . Sulfa Antibiotics Hives    Current Outpatient Medications  Medication Sig Dispense Refill  . calcium citrate-vitamin D (CITRACAL+D) 315-200 MG-UNIT per tablet Take 1 tablet by mouth 2 (two) times daily.    . cyclobenzaprine (FLEXERIL) 10 MG tablet Take 1 tablet (10 mg total) by mouth 3 (three) times daily. 90 tablet 3  . levalbuterol (XOPENEX HFA) 45 MCG/ACT inhaler Inhale 2 puffs into the lungs every 6 (six) hours as needed.    . traMADol (ULTRAM) 50 MG tablet Take 1 tablet (50 mg total) by mouth every 6 (six) hours as needed. One tablet every six hours as needed for pain. 60 tablet 4  . TRELEGY ELLIPTA 100-62.5-25 MCG/INH AEPB Inhale 1 puff into the lungs daily.    . naproxen (NAPROSYN) 500 MG tablet Take 1 tablet (500 mg total) by mouth 2 (two) times daily with a meal. (Patient not taking: No sig reported) 60 tablet 5  . predniSONE (STERAPRED UNI-PAK 21  TAB) 10 MG (21) TBPK tablet Take six pills the first day;5 pills the next day;4 pills the next day;3 pills the next day; 2 pills the next day,one the final day. (Patient not taking: No sig reported) 21 tablet 1  . sodium phosphates (OSMOPREP) 1.102-0.398 G TABS Take 1 tablet by mouth once. (Patient not taking: Reported on 06/27/2020) 32 tablet 0   No current facility-administered medications for this visit.     Physical Exam  Blood pressure (!) 134/97, pulse (!) 108, height '5\' 2"'  (1.575 m), weight 148 lb (67.1 kg).  Constitutional: overall normal hygiene, normal nutrition, well developed, normal grooming, normal body habitus. Assistive device:none  Musculoskeletal: gait and station Limp right, muscle tone and  strength are normal, no tremors or atrophy is present.  .  Neurological: coordination overall normal.  Deep tendon reflex/nerve stretch intact.  Sensation normal.  Cranial nerves II-XII intact.   Skin:   Normal overall no scars, lesions, ulcers or rashes. No psoriasis.  Psychiatric: Alert and oriented x 3.  Recent memory intact, remote memory unclear.  Normal mood and affect. Well groomed.  Good eye contact.  Cardiovascular: overall no swelling, no varicosities, no edema bilaterally, normal temperatures of the legs and arms, no clubbing, cyanosis and good capillary refill.  Lymphatic: palpation is normal.  Right heel is tender plantar side.  No redness or swelling.  All other systems reviewed and are negative   The patient has been educated about the nature of the problem(s) and counseled on treatment options.  The patient appeared to understand what I have discussed and is in agreement with it.  Encounter Diagnosis  Name Primary?  . Chronic heel pain, right Yes   I have told her about stretching exercises for the heel and ankle.  She will begin this.  PLAN Call if any problems.  Precautions discussed.  Continue current medications.   Return to clinic 6 weeks   Electronically Signed Sanjuana Kava, MD 1/4/20224:23 PM

## 2020-08-08 ENCOUNTER — Ambulatory Visit: Payer: Medicare Other | Admitting: Orthopaedic Surgery

## 2020-08-10 ENCOUNTER — Ambulatory Visit (INDEPENDENT_AMBULATORY_CARE_PROVIDER_SITE_OTHER): Payer: Medicare Other | Admitting: Orthopaedic Surgery

## 2020-08-10 ENCOUNTER — Encounter: Payer: Self-pay | Admitting: Orthopaedic Surgery

## 2020-08-10 ENCOUNTER — Other Ambulatory Visit: Payer: Self-pay

## 2020-08-10 VITALS — BP 160/109 | HR 95 | Ht 62.0 in | Wt 148.5 lb

## 2020-08-10 DIAGNOSIS — G8929 Other chronic pain: Secondary | ICD-10-CM | POA: Diagnosis not present

## 2020-08-10 DIAGNOSIS — M79671 Pain in right foot: Secondary | ICD-10-CM

## 2020-08-10 DIAGNOSIS — M545 Low back pain, unspecified: Secondary | ICD-10-CM | POA: Diagnosis not present

## 2020-08-10 MED ORDER — TRAMADOL HCL 50 MG PO TABS
50.0000 mg | ORAL_TABLET | Freq: Four times a day (QID) | ORAL | 4 refills | Status: DC | PRN
Start: 2020-08-10 — End: 2021-01-23

## 2020-08-10 NOTE — Patient Instructions (Signed)

## 2020-08-10 NOTE — Progress Notes (Signed)
Patient RF:Ellen Reynolds, female DOB:05-Aug-1950, 70 y.o. [id number]  Chief Complaint  Patient presents with  . Foot Pain    R/ its better. My back bothers me but I deal with it.    HPI  Ellen Reynolds is a 70 y.o. female who has right heel pain and lower back pain.  Her heel pain is much improved.  She has been doing the stretching exercises.  Her back is tender and she has good and bad days, more bad days with the cold weather.  She has no new trauma, no weakness.   Body mass index is 27.16 kg/m.  ROS  Review of Systems  Constitutional: Positive for activity change.  Respiratory: Positive for shortness of breath.   Musculoskeletal: Positive for arthralgias and back pain.  All other systems reviewed and are negative.   All other systems reviewed and are negative.  The following is a summary of the past history medically, past history surgically, known current medicines, social history and family history.  This information is gathered electronically by the computer from prior information and documentation.  I review this each visit and have found including this information at this point in the chart is beneficial and informative.    Past Medical History:  Diagnosis Date  . Arthritis   . Chronic back pain   . COPD (chronic obstructive pulmonary disease) ([place])   . Generalized anxiety disorder   . Hyperlipidemia   . Insomnia   . Rectal bleeding     Past Surgical History:  Procedure Laterality Date  . COLONOSCOPY  01/22/2011   Procedure: COLONOSCOPY;  Surgeon: Jamesetta So;  [facility];  Service: Gastroenterology;  Laterality: N/A;  . DILATION AND CURETTAGE OF UTERUS      Family History  Problem Relation Age of Onset  . Multiple myeloma Mother   . Cancer Mother   . Melanoma Father   . Diabetes Mellitus II Father   . Hypertension Father   . Cancer Father   . Colon cancer Maternal Aunt     Social History Social History   Tobacco Use  .  Smoking status: Current Every Day Smoker  . Smokeless tobacco: Never Used  . Tobacco comment: 1 pack a day x 40 yrs  Substance Use Topics  . Alcohol use: No  . Drug use: No    Allergies  Allergen Reactions  . Codeine Nausea Only  . Macrodantin Hives  . Sulfa Antibiotics Hives    Current Outpatient Medications  Medication Sig Dispense Refill  . calcium citrate-vitamin D (CITRACAL+D) 315-200 MG-UNIT per tablet Take 1 tablet by mouth 2 (two) times daily.    . cyclobenzaprine (FLEXERIL) 10 MG tablet Take 1 tablet (10 mg total) by mouth 3 (three) times daily. 90 tablet 3  . levalbuterol (XOPENEX HFA) 45 MCG/ACT inhaler Inhale 2 puffs into the lungs every 6 (six) hours as needed.    . naproxen (NAPROSYN) 500 MG tablet Take 1 tablet (500 mg total) by mouth 2 (two) times daily with a meal. 60 tablet 5  . predniSONE (STERAPRED UNI-PAK 21 TAB) 10 MG (21) TBPK tablet Take six pills the first day;5 pills the next day;4 pills the next day;3 pills the next day; 2 pills the next day,one the final day. 21 tablet 1  . sodium phosphates (OSMOPREP) 1.102-0.398 G TABS Take 1 tablet by mouth once. 32 tablet 0  . TRELEGY ELLIPTA 100-62.5-25 MCG/INH AEPB Inhale 1 puff into the lungs daily.    Marland Kitchen  traMADol (ULTRAM) 50 MG tablet Take 1 tablet (50 mg total) by mouth every 6 (six) hours as needed. One tablet every six hours as needed for pain. 60 tablet 4   No current facility-administered medications for this visit.     Physical Exam  Blood pressure (!) 160/109, pulse 95, height '5\' 2"'  (1.575 m), weight 148 lb 8 oz (67.4 kg).  Constitutional: overall normal hygiene, normal nutrition, well developed, normal grooming, normal body habitus. Assistive device:none  Musculoskeletal: gait and station Limp none, muscle tone and strength are normal, no tremors or atrophy is present.  .  Neurological: coordination overall normal.  Deep tendon reflex/nerve stretch intact.  Sensation normal.  Cranial nerves II-XII  intact.   Skin:   Normal overall no scars, lesions, ulcers or rashes. No psoriasis.  Psychiatric: Alert and oriented x 3.  Recent memory intact, remote memory unclear.  Normal mood and affect. Well groomed.  Good eye contact.  Cardiovascular: overall no swelling, no varicosities, no edema bilaterally, normal temperatures of the legs and arms, no clubbing, cyanosis and good capillary refill.  Lymphatic: palpation is normal.  Spine/Pelvis examination:  Inspection:  Overall, sacoiliac joint benign and hips nontender; without crepitus or defects.   Thoracic spine inspection: Alignment normal without kyphosis present   Lumbar spine inspection:  Alignment  with normal lumbar lordosis, without scoliosis apparent.   Thoracic spine palpation:  without tenderness of spinal processes   Lumbar spine palpation: without tenderness of lumbar area; without tightness of lumbar muscles    Range of Motion:   Lumbar flexion, forward flexion is normal without pain or tenderness    Lumbar extension is full without pain or tenderness   Left lateral bend is normal without pain or tenderness   Right lateral bend is normal without pain or tenderness   Straight leg raising is normal  Strength & tone: normal   Stability overall normal stability Her right heel is only slightly tender.  All other systems reviewed and are negative   The patient has been educated about the nature of the problem(s) and counseled on treatment options.  The patient appeared to understand what I have discussed and is in agreement with it.  Encounter Diagnoses  Name Primary?  . Chronic heel pain, right Yes  . Chronic midline low back pain without sciatica     PLAN Call if any problems.  Precautions discussed.  Continue current medications.   Return to clinic 3 months   I have reviewed the [place] web site prior to prescribing narcotic medicine for this  patient.   Electronically Signed Sanjuana Kava, MD 2/17/202210:43 AM

## 2020-08-24 ENCOUNTER — Ambulatory Visit: Payer: Medicare Other | Admitting: Orthopaedic Surgery

## 2020-09-21 ENCOUNTER — Ambulatory Visit: Payer: Medicare Other

## 2020-09-21 ENCOUNTER — Encounter: Payer: Self-pay | Admitting: Orthopaedic Surgery

## 2020-09-21 ENCOUNTER — Other Ambulatory Visit: Payer: Self-pay

## 2020-09-21 ENCOUNTER — Ambulatory Visit (INDEPENDENT_AMBULATORY_CARE_PROVIDER_SITE_OTHER): Payer: Medicare Other | Admitting: Orthopaedic Surgery

## 2020-09-21 VITALS — BP 133/78 | HR 102 | Ht 62.0 in | Wt 146.2 lb

## 2020-09-21 DIAGNOSIS — G8929 Other chronic pain: Secondary | ICD-10-CM

## 2020-09-21 DIAGNOSIS — M25551 Pain in right hip: Secondary | ICD-10-CM | POA: Diagnosis not present

## 2020-09-21 DIAGNOSIS — M545 Low back pain, unspecified: Secondary | ICD-10-CM

## 2020-09-21 MED ORDER — PREDNISONE 5 MG (21) PO TBPK: ORAL_TABLET | ORAL | 0 refills | Status: DC

## 2020-09-21 NOTE — Progress Notes (Signed)
Patient Ellen Reynolds, female DOB:April 26, 1951, 70 y.o. [id number]  Chief Complaint  Patient presents with  . Hip Pain    Right hip at crest and radiating anterior all the way to posterior hip and has right LBP, radiculopathy no n/t  . Back Pain    HPI  IVIS Ellen Reynolds is a 70 y.o. female who is having pain in the right lower back, the right pelvic brim and the right abdomen.  She has pain that runs to the right buttocks and past the hip to lower right thigh.  She has no trauma.  She has no weakness.  The pain is sharp.  It last a few seconds but gets her attention.  She is on Toradol which helps.  She has no weakness. She is chronically constipated.   Body mass index is 26.74 kg/m.  ROS  Review of Systems  Constitutional: Positive for activity change.  Respiratory: Positive for shortness of breath.   Musculoskeletal: Positive for arthralgias and back pain.  All other systems reviewed and are negative.   All other systems reviewed and are negative.  The following is a summary of the past history medically, past history surgically, known current medicines, social history and family history.  This information is gathered electronically by the computer from prior information and documentation.  I review this each visit and have found including this information at this point in the chart is beneficial and informative.    Past Medical History:  Diagnosis Date  . Arthritis   . Chronic back pain   . COPD (chronic obstructive pulmonary disease) ([place])   . Generalized anxiety disorder   . Hyperlipidemia   . Insomnia   . Rectal bleeding     Past Surgical History:  Procedure Laterality Date  . COLONOSCOPY  01/22/2011   Procedure: COLONOSCOPY;  Surgeon: Jamesetta So;  [facility];  Service: Gastroenterology;  Laterality: N/A;  . DILATION AND CURETTAGE OF UTERUS      Family History  Problem Relation Age of Onset  . Multiple myeloma Mother   . Cancer Mother    . Melanoma Father   . Diabetes Mellitus II Father   . Hypertension Father   . Cancer Father   . Colon cancer Maternal Aunt     Social History Social History   Tobacco Use  . Smoking status: Current Every Day Smoker  . Smokeless tobacco: Never Used  . Tobacco comment: 1 pack a day x 40 yrs  Substance Use Topics  . Alcohol use: No  . Drug use: No    Allergies  Allergen Reactions  . Codeine Nausea Only  . Macrodantin Hives  . Sulfa Antibiotics Hives    Current Outpatient Medications  Medication Sig Dispense Refill  . calcium citrate-vitamin D (CITRACAL+D) 315-200 MG-UNIT per tablet Take 1 tablet by mouth 2 (two) times daily.    . cyclobenzaprine (FLEXERIL) 10 MG tablet Take 1 tablet (10 mg total) by mouth 3 (three) times daily. 90 tablet 3  . levalbuterol (XOPENEX HFA) 45 MCG/ACT inhaler Inhale 2 puffs into the lungs every 6 (six) hours as needed.    . naproxen (NAPROSYN) 500 MG tablet Take 1 tablet (500 mg total) by mouth 2 (two) times daily with a meal. 60 tablet 5  . predniSONE (STERAPRED UNI-PAK 21 TAB) 5 MG (21) TBPK tablet Take 6 pills first day; 5 pills second day; 4 pills third day; 3 pills fourth day; 2 pills next day and 1 pill last day.  21 tablet 0  . sodium phosphates (OSMOPREP) 1.102-0.398 G TABS Take 1 tablet by mouth once. 32 tablet 0  . traMADol (ULTRAM) 50 MG tablet Take 1 tablet (50 mg total) by mouth every 6 (six) hours as needed. One tablet every six hours as needed for pain. 60 tablet 4  . TRELEGY ELLIPTA 100-62.5-25 MCG/INH AEPB Inhale 1 puff into the lungs daily.     No current facility-administered medications for this visit.     Physical Exam  Blood pressure 133/78, pulse (!) 102, height 5' 2" (1.575 m), weight 146 lb 3.2 oz (66.3 kg).  Constitutional: overall normal hygiene, normal nutrition, well developed, normal grooming, normal body habitus. Assistive device:none  Musculoskeletal: gait and station Limp none, muscle tone and strength are  normal, no tremors or atrophy is present.  .  Neurological: coordination overall normal.  Deep tendon reflex/nerve stretch intact.  Sensation normal.  Cranial nerves II-XII intact.   Skin:   Normal overall no scars, lesions, ulcers or rashes. No psoriasis.  Psychiatric: Alert and oriented x 3.  Recent memory intact, remote memory unclear.  Normal mood and affect. Well groomed.  Good eye contact.  Cardiovascular: overall no swelling, no varicosities, no edema bilaterally, normal temperatures of the legs and arms, no clubbing, cyanosis and good capillary refill.  Lymphatic: palpation is normal.  Spine/Pelvis examination:  Inspection:  Overall, sacoiliac joint benign and hips nontender; without crepitus or defects.   Thoracic spine inspection: Alignment normal without kyphosis present   Lumbar spine inspection:  Alignment  with normal lumbar lordosis, without scoliosis apparent.   Thoracic spine palpation:  without tenderness of spinal processes   Lumbar spine palpation: without tenderness of lumbar area; without tightness of lumbar muscles    Range of Motion:   Lumbar flexion, forward flexion is normal without pain or tenderness    Lumbar extension is full without pain or tenderness   Left lateral bend is normal without pain or tenderness   Right lateral bend is normal without pain or tenderness   Straight leg raising is normal  Strength & tone: normal   Stability overall normal stability  All other systems reviewed and are negative   The patient has been educated about the nature of the problem(s) and counseled on treatment options.  The patient appeared to understand what I have discussed and is in agreement with it.  Encounter Diagnoses  Name Primary?  . Chronic midline low back pain without sciatica Yes  . Right hip pain     PLAN Call if any problems.  Precautions discussed.  Continue current medications.   Return to clinic 1 week   Begin prednisone dose  pack.  Consider laxative.  She may need MRI.  Call if any problem.  Precautions discussed.   Electronically Signed Sanjuana Kava, MD 3/31/202211:17 AM

## 2020-09-28 ENCOUNTER — Other Ambulatory Visit: Payer: Self-pay

## 2020-09-28 ENCOUNTER — Ambulatory Visit (INDEPENDENT_AMBULATORY_CARE_PROVIDER_SITE_OTHER): Payer: Medicare Other | Admitting: Orthopaedic Surgery

## 2020-09-28 ENCOUNTER — Encounter: Payer: Self-pay | Admitting: Orthopaedic Surgery

## 2020-09-28 VITALS — BP 137/85 | HR 79 | Ht 62.0 in | Wt 149.0 lb

## 2020-09-28 DIAGNOSIS — G8929 Other chronic pain: Secondary | ICD-10-CM | POA: Diagnosis not present

## 2020-09-28 DIAGNOSIS — M25551 Pain in right hip: Secondary | ICD-10-CM

## 2020-09-28 DIAGNOSIS — M545 Low back pain, unspecified: Secondary | ICD-10-CM | POA: Diagnosis not present

## 2020-09-28 DIAGNOSIS — F17210 Nicotine dependence, cigarettes, uncomplicated: Secondary | ICD-10-CM | POA: Diagnosis not present

## 2020-09-28 NOTE — Progress Notes (Signed)
Patient Ellen Reynolds, female DOB:06/07/1951, 70 y.o. [id number]  Chief Complaint  Patient presents with  . Back Pain    Hurting real bad    HPI  Ellen Reynolds is a 70 y.o. female who has lower back pain. She did well on the prednisone but had a bad day two days ago.  She is better today.  I have offered a MRI but she declines.  She says she will put up with the pain.  She is going to see her family doctor about her constipation.  She prefers to come back here as needed.     Body mass index is 27.25 kg/m.  ROS  Review of Systems  Constitutional: Positive for activity change.  Respiratory: Positive for shortness of breath.   Musculoskeletal: Positive for arthralgias and back pain.  All other systems reviewed and are negative.   All other systems reviewed and are negative.  The following is a summary of the past history medically, past history surgically, known current medicines, social history and family history.  This information is gathered electronically by the computer from prior information and documentation.  I review this each visit and have found including this information at this point in the chart is beneficial and informative.    Past Medical History:  Diagnosis Date  . Arthritis   . Chronic back pain   . COPD (chronic obstructive pulmonary disease) ([place])   . Generalized anxiety disorder   . Hyperlipidemia   . Insomnia   . Rectal bleeding     Past Surgical History:  Procedure Laterality Date  . COLONOSCOPY  01/22/2011   Procedure: COLONOSCOPY;  Surgeon: Jamesetta So;  [facility];  Service: Gastroenterology;  Laterality: N/A;  . DILATION AND CURETTAGE OF UTERUS      Family History  Problem Relation Age of Onset  . Multiple myeloma Mother   . Cancer Mother   . Melanoma Father   . Diabetes Mellitus II Father   . Hypertension Father   . Cancer Father   . Colon cancer Maternal Aunt     Social History Social History   Tobacco  Use  . Smoking status: Current Every Day Smoker  . Smokeless tobacco: Never Used  . Tobacco comment: 1 pack a day x 40 yrs  Substance Use Topics  . Alcohol use: No  . Drug use: No    Allergies  Allergen Reactions  . Codeine Nausea Only  . Macrodantin Hives  . Sulfa Antibiotics Hives    Current Outpatient Medications  Medication Sig Dispense Refill  . calcium citrate-vitamin D (CITRACAL+D) 315-200 MG-UNIT per tablet Take 1 tablet by mouth 2 (two) times daily.    . cyclobenzaprine (FLEXERIL) 10 MG tablet Take 1 tablet (10 mg total) by mouth 3 (three) times daily. 90 tablet 3  . levalbuterol (XOPENEX HFA) 45 MCG/ACT inhaler Inhale 2 puffs into the lungs every 6 (six) hours as needed.    . naproxen (NAPROSYN) 500 MG tablet Take 1 tablet (500 mg total) by mouth 2 (two) times daily with a meal. 60 tablet 5  . predniSONE (STERAPRED UNI-PAK 21 TAB) 5 MG (21) TBPK tablet Take 6 pills first day; 5 pills second day; 4 pills third day; 3 pills fourth day; 2 pills next day and 1 pill last day. 21 tablet 0  . sodium phosphates (OSMOPREP) 1.102-0.398 G TABS Take 1 tablet by mouth once. 32 tablet 0  . traMADol (ULTRAM) 50 MG tablet Take 1 tablet (50  mg total) by mouth every 6 (six) hours as needed. One tablet every six hours as needed for pain. 60 tablet 4  . TRELEGY ELLIPTA 100-62.5-25 MCG/INH AEPB Inhale 1 puff into the lungs daily.     No current facility-administered medications for this visit.     Physical Exam  Blood pressure 137/85, pulse 79, height '5\' 2"'  (1.575 m), weight 149 lb (67.6 kg).  Constitutional: overall normal hygiene, normal nutrition, well developed, normal grooming, normal body habitus. Assistive device:none  Musculoskeletal: gait and station Limp none, muscle tone and strength are normal, no tremors or atrophy is present.  .  Neurological: coordination overall normal.  Deep tendon reflex/nerve stretch intact.  Sensation normal.  Cranial nerves II-XII intact.   Skin:    Normal overall no scars, lesions, ulcers or rashes. No psoriasis.  Psychiatric: Alert and oriented x 3.  Recent memory intact, remote memory unclear.  Normal mood and affect. Well groomed.  Good eye contact.  Cardiovascular: overall no swelling, no varicosities, no edema bilaterally, normal temperatures of the legs and arms, no clubbing, cyanosis and good capillary refill.  Lymphatic: palpation is normal.  Spine/Pelvis examination:  Inspection:  Overall, sacoiliac joint benign and hips nontender; without crepitus or defects.   Thoracic spine inspection: Alignment normal without kyphosis present   Lumbar spine inspection:  Alignment  with normal lumbar lordosis, without scoliosis apparent.   Thoracic spine palpation:  without tenderness of spinal processes   Lumbar spine palpation: without tenderness of lumbar area; without tightness of lumbar muscles    Range of Motion:   Lumbar flexion, forward flexion is normal without pain or tenderness    Lumbar extension is full without pain or tenderness   Left lateral bend is normal without pain or tenderness   Right lateral bend is normal without pain or tenderness   Straight leg raising is normal  Strength & tone: normal   Stability overall normal stability  All other systems reviewed and are negative   The patient has been educated about the nature of the problem(s) and counseled on treatment options.  The patient appeared to understand what I have discussed and is in agreement with it.  Encounter Diagnoses  Name Primary?  . Chronic midline low back pain without sciatica Yes  . Right hip pain   . Nicotine dependence, cigarettes, uncomplicated     PLAN Call if any problems.  Precautions discussed.  Continue current medications.   Return to clinic prn   Electronically Signed Sanjuana Kava, MD 4/7/20229:00 AM

## 2020-09-28 NOTE — Patient Instructions (Signed)

## 2020-11-03 NOTE — Progress Notes (Signed)
This encounter was created in error - please disregard.

## 2020-11-09 ENCOUNTER — Encounter: Payer: Self-pay | Admitting: Orthopaedic Surgery

## 2020-11-09 ENCOUNTER — Ambulatory Visit (INDEPENDENT_AMBULATORY_CARE_PROVIDER_SITE_OTHER): Payer: Medicare Other | Admitting: Orthopaedic Surgery

## 2020-11-09 ENCOUNTER — Other Ambulatory Visit: Payer: Self-pay

## 2020-11-09 VITALS — BP 143/78 | HR 93 | Ht 62.0 in | Wt 147.0 lb

## 2020-11-09 DIAGNOSIS — G8929 Other chronic pain: Secondary | ICD-10-CM

## 2020-11-09 DIAGNOSIS — M79671 Pain in right foot: Secondary | ICD-10-CM | POA: Diagnosis not present

## 2020-11-09 DIAGNOSIS — F17210 Nicotine dependence, cigarettes, uncomplicated: Secondary | ICD-10-CM | POA: Diagnosis not present

## 2020-11-09 DIAGNOSIS — M545 Low back pain, unspecified: Secondary | ICD-10-CM | POA: Diagnosis not present

## 2020-11-09 NOTE — Progress Notes (Signed)
Patient Ellen Reynolds, female DOB:28-Jul-1950, 70 y.o. ULA:[id number]  Chief Complaint  Patient presents with  . Back Pain  . Foot Pain    HPI  Ellen Reynolds is a 70 y.o. female who has chronic lower back pain and some heel pain.  Her heel pain is better.  Her back has good and bad days.  She has no new trauma, no weakness. She is doing her exercises and taking her medicine.   Body mass index is 26.89 kg/m.  ROS  Review of Systems  Constitutional: Positive for activity change.  Respiratory: Positive for shortness of breath.   Musculoskeletal: Positive for arthralgias and back pain.  All other systems reviewed and are negative.   All other systems reviewed and are negative.  The following is a summary of the past history medically, past history surgically, known current medicines, social history and family history.  This information is gathered electronically by the computer from prior information and documentation.  I review this each visit and have found including this information at this point in the chart is beneficial and informative.    Past Medical History:  Diagnosis Date  . Arthritis   . Chronic back pain   . COPD (chronic obstructive pulmonary disease) ([place])   . Generalized anxiety disorder   . Hyperlipidemia   . Insomnia   . Rectal bleeding     Past Surgical History:  Procedure Laterality Date  . COLONOSCOPY  01/22/2011   Procedure: COLONOSCOPY;  Surgeon: Jamesetta So;  Location: [facility];  Service: Gastroenterology;  Laterality: N/A;  . DILATION AND CURETTAGE OF UTERUS      Family History  Problem Relation Age of Onset  . Multiple myeloma Mother   . Cancer Mother   . Melanoma Father   . Diabetes Mellitus II Father   . Hypertension Father   . Cancer Father   . Colon cancer Maternal Aunt     Social History Social History   Tobacco Use  . Smoking status: Current Every Day Smoker  . Smokeless tobacco: Never Used  . Tobacco comment: 1  pack a day x 40 yrs  Substance Use Topics  . Alcohol use: No  . Drug use: No    Allergies  Allergen Reactions  . Codeine Nausea Only  . Macrodantin Hives  . Sulfa Antibiotics Hives    Current Outpatient Medications  Medication Sig Dispense Refill  . calcium citrate-vitamin D (CITRACAL+D) 315-200 MG-UNIT per tablet Take 1 tablet by mouth 2 (two) times daily.    . cyclobenzaprine (FLEXERIL) 10 MG tablet Take 1 tablet (10 mg total) by mouth 3 (three) times daily. 90 tablet 3  . levalbuterol (XOPENEX HFA) 45 MCG/ACT inhaler Inhale 2 puffs into the lungs every 6 (six) hours as needed.    . naproxen (NAPROSYN) 500 MG tablet Take 1 tablet (500 mg total) by mouth 2 (two) times daily with a meal. 60 tablet 5  . sodium phosphates (OSMOPREP) 1.102-0.398 G TABS Take 1 tablet by mouth once. 32 tablet 0  . traMADol (ULTRAM) 50 MG tablet Take 1 tablet (50 mg total) by mouth every 6 (six) hours as needed. One tablet every six hours as needed for pain. 60 tablet 4  . TRELEGY ELLIPTA 100-62.5-25 MCG/INH AEPB Inhale 1 puff into the lungs daily.    . predniSONE (STERAPRED UNI-PAK 21 TAB) 5 MG (21) TBPK tablet Take 6 pills first day; 5 pills second day; 4 pills third day; 3 pills fourth day; 2  pills next day and 1 pill last day. (Patient not taking: Reported on 11/09/2020) 21 tablet 0   No current facility-administered medications for this visit.     Physical Exam  Blood pressure (!) 143/78, pulse 93, height '5\' 2"'  (1.575 m), weight 147 lb (66.7 kg).  Constitutional: overall normal hygiene, normal nutrition, well developed, normal grooming, normal body habitus. Assistive device:none  Musculoskeletal: gait and station Limp none, muscle tone and strength are normal, no tremors or atrophy is present.  .  Neurological: coordination overall normal.  Deep tendon reflex/nerve stretch intact.  Sensation normal.  Cranial nerves II-XII intact.   Skin:   Normal overall no scars, lesions, ulcers or rashes. No  psoriasis.  Psychiatric: Alert and oriented x 3.  Recent memory intact, remote memory unclear.  Normal mood and affect. Well groomed.  Good eye contact.  Cardiovascular: overall no swelling, no varicosities, no edema bilaterally, normal temperatures of the legs and arms, no clubbing, cyanosis and good capillary refill.  Spine/Pelvis examination:  Inspection:  Overall, sacoiliac joint benign and hips nontender; without crepitus or defects.   Thoracic spine inspection: Alignment normal without kyphosis present   Lumbar spine inspection:  Alignment  with normal lumbar lordosis, without scoliosis apparent.   Thoracic spine palpation:  without tenderness of spinal processes   Lumbar spine palpation: without tenderness of lumbar area; without tightness of lumbar muscles    Range of Motion:   Lumbar flexion, forward flexion is normal without pain or tenderness    Lumbar extension is full without pain or tenderness   Left lateral bend is normal without pain or tenderness   Right lateral bend is normal without pain or tenderness   Straight leg raising is normal  Strength & tone: normal   Stability overall normal stability  Lymphatic: palpation is normal.  All other systems reviewed and are negative   The patient has been educated about the nature of the problem(s) and counseled on treatment options.  The patient appeared to understand what I have discussed and is in agreement with it.  Encounter Diagnoses  Name Primary?  . Chronic midline low back pain without sciatica Yes  . Chronic heel pain, right   . Nicotine dependence, cigarettes, uncomplicated     PLAN Call if any problems.  Precautions discussed.  Continue current medications.   Return to clinic 3 months   Electronically Signed Sanjuana Kava, MD 5/19/20228:22 AM

## 2021-01-23 ENCOUNTER — Telehealth: Payer: Self-pay | Admitting: Orthopaedic Surgery

## 2021-01-23 MED ORDER — TRAMADOL HCL 50 MG PO TABS: 50.0000 mg | ORAL_TABLET | Freq: Four times a day (QID) | ORAL | 4 refills | Status: DC | PRN

## 2021-01-23 MED ORDER — PREDNISONE 5 MG (21) PO TBPK: ORAL_TABLET | ORAL | 0 refills | Status: DC

## 2021-01-23 NOTE — Telephone Encounter (Signed)
Patient requests Prednisone 10 mgs.  She uses [facility] 

## 2021-01-23 NOTE — Telephone Encounter (Signed)
Patient requests refill on Tramadol 50 mgs.  Qty  60  Sig: Take 1 tablet (50 mg total) by mouth every 6 (six) hours as needed. One tablet every six hours as needed for pain.  Patient states she uses [facility] 

## 2021-02-20 ENCOUNTER — Ambulatory Visit (INDEPENDENT_AMBULATORY_CARE_PROVIDER_SITE_OTHER): Payer: Medicare Other | Admitting: Orthopaedic Surgery

## 2021-02-20 ENCOUNTER — Other Ambulatory Visit: Payer: Self-pay

## 2021-02-20 ENCOUNTER — Ambulatory Visit: Payer: Medicare Other

## 2021-02-20 ENCOUNTER — Encounter: Payer: Self-pay | Admitting: Orthopaedic Surgery

## 2021-02-20 VITALS — BP 170/90 | HR 80 | Ht 62.0 in | Wt 150.0 lb

## 2021-02-20 DIAGNOSIS — F17210 Nicotine dependence, cigarettes, uncomplicated: Secondary | ICD-10-CM | POA: Diagnosis not present

## 2021-02-20 DIAGNOSIS — G8929 Other chronic pain: Secondary | ICD-10-CM

## 2021-02-20 DIAGNOSIS — M25572 Pain in left ankle and joints of left foot: Secondary | ICD-10-CM | POA: Diagnosis not present

## 2021-02-20 DIAGNOSIS — M545 Low back pain, unspecified: Secondary | ICD-10-CM | POA: Diagnosis not present

## 2021-02-20 NOTE — Progress Notes (Signed)
My ankle is hurting more.  She has left ankle pain that hurts when walking and at night. She has no trauma.  She has a limp at times.  She has good and bad days. She has no trauma, no swelling, no redness.  Her back has good and bad days and that has been going on for years.  She has no weakness. She is active and doing her exercises.  Spine/Pelvis examination:  Inspection:  Overall, sacoiliac joint benign and hips nontender; without crepitus or defects.   Thoracic spine inspection: Alignment normal without kyphosis present   Lumbar spine inspection:  Alignment  with normal lumbar lordosis, without scoliosis apparent.   Thoracic spine palpation:  without tenderness of spinal processes   Lumbar spine palpation: without tenderness of lumbar area; without tightness of lumbar muscles    Range of Motion:   Lumbar flexion, forward flexion is normal without pain or tenderness    Lumbar extension is full without pain or tenderness   Left lateral bend is normal without pain or tenderness   Right lateral bend is normal without pain or tenderness   Straight leg raising is normal  Strength & tone: normal   Stability overall normal stability  Left ankle has good ROM and no crepitus.  NV intact.  She has no redness or swelling.  Encounter Diagnoses  Name Primary?   Chronic pain of left ankle Yes   Chronic midline low back pain without sciatica    Nicotine dependence, cigarettes, uncomplicated    X-rays were done of the left ankle, reported separately.  Return in three months. Continue her Tramadol.  Call if any problem.  Precautions discussed.  Electronically Signed Darreld Mclean, MD 8/30/20228:48 AM

## 2021-05-24 ENCOUNTER — Ambulatory Visit (INDEPENDENT_AMBULATORY_CARE_PROVIDER_SITE_OTHER): Payer: PPO | Admitting: Orthopaedic Surgery

## 2021-05-24 ENCOUNTER — Other Ambulatory Visit: Payer: Self-pay

## 2021-05-24 ENCOUNTER — Encounter: Payer: Self-pay | Admitting: Orthopaedic Surgery

## 2021-05-24 VITALS — BP 150/92 | HR 98 | Ht 62.0 in | Wt 150.0 lb

## 2021-05-24 DIAGNOSIS — M545 Low back pain, unspecified: Secondary | ICD-10-CM | POA: Diagnosis not present

## 2021-05-24 DIAGNOSIS — F17210 Nicotine dependence, cigarettes, uncomplicated: Secondary | ICD-10-CM | POA: Diagnosis not present

## 2021-05-24 DIAGNOSIS — G8929 Other chronic pain: Secondary | ICD-10-CM

## 2021-05-24 MED ORDER — TRAMADOL HCL 50 MG PO TABS: 50.0000 mg | ORAL_TABLET | Freq: Four times a day (QID) | ORAL | 4 refills | Status: DC | PRN

## 2021-05-24 NOTE — Progress Notes (Signed)
My back hurts more with the cold weather.  She has had more lower back pain and right sided sciatica with the cooler weather.  She has no new trauma, no weakness.  She is taking her medicine and doing her exercises.  Spine/Pelvis examination:  Inspection:  Overall, sacoiliac joint benign and hips nontender; without crepitus or defects.   Thoracic spine inspection: Alignment normal without kyphosis present   Lumbar spine inspection:  Alignment  with normal lumbar lordosis, without scoliosis apparent.   Thoracic spine palpation:  without tenderness of spinal processes   Lumbar spine palpation: without tenderness of lumbar area; without tightness of lumbar muscles    Range of Motion:   Lumbar flexion, forward flexion is normal without pain or tenderness    Lumbar extension is full without pain or tenderness   Left lateral bend is normal without pain or tenderness   Right lateral bend is normal without pain or tenderness   Straight leg raising is normal  Strength & tone: normal   Stability overall normal stability  Encounter Diagnoses  Name Primary?   Chronic midline low back pain without sciatica Yes   Nicotine dependence, cigarettes, uncomplicated    I will refill her Tramadol.  I have reviewed the [place] Controlled Substance Reporting System web site prior to prescribing narcotic medicine for this patient.  Return in three months.  Continue exercises.  Call if any problem.  Precautions discussed.  Electronically Signed Darreld Mclean, MD 12/1/20228:19 AM

## 2021-05-24 NOTE — Patient Instructions (Signed)
Steps to Quit Smoking Smoking tobacco is the leading cause of preventable death. It can affect almost every organ in the body. Smoking puts you and people around you at risk for many serious, long-lasting (chronic) diseases. Quitting smoking can be hard, but it is one of the best things that you can do for your health. It is never too late to quit. How do I get ready to quit? When you decide to quit smoking, make a plan to help you succeed. Before you quit: Pick a date to quit. Set a date within the next 2 weeks to give you time to prepare. Write down the reasons why you are quitting. Keep this list in places where you will see it often. Tell your family, friends, and co-workers that you are quitting. Their support is important. Talk with your doctor about the choices that may help you quit. Find out if your health insurance will pay for these treatments. Know the people, places, things, and activities that make you want to smoke (triggers). Avoid them. What first steps can I take to quit smoking? Throw away all cigarettes at home, at work, and in your car. Throw away the things that you use when you smoke, such as ashtrays and lighters. Clean your car. Make sure to empty the ashtray. Clean your home, including curtains and carpets. What can I do to help me quit smoking? Talk with your doctor about taking medicines and seeing a counselor at the same time. You are more likely to succeed when you do both. If you are pregnant or breastfeeding, talk with your doctor about counseling or other ways to quit smoking. Do not take medicine to help you quit smoking unless your doctor tells you to do so. To quit smoking: Quit right away Quit smoking totally, instead of slowly cutting back on how much you smoke over a period of time. Go to counseling. You are more likely to quit if you go to counseling sessions regularly. Take medicine You may take medicines to help you quit. Some medicines need a  prescription, and some you can buy over-the-counter. Some medicines may contain a drug called nicotine to replace the nicotine in cigarettes. Medicines may: Help you to stop having the desire to smoke (cravings). Help to stop the problems that come when you stop smoking (withdrawal symptoms). Your doctor may ask you to use: Nicotine patches, gum, or lozenges. Nicotine inhalers or sprays. Non-nicotine medicine that is taken by mouth. Find resources Find resources and other ways to help you quit smoking and remain smoke-free after you quit. These resources are most helpful when you use them often. They include: Online chats with a counselor. Phone quitlines. Printed self-help materials. Support groups or group counseling. Text messaging programs. Mobile phone apps. Use apps on your mobile phone or tablet that can help you stick to your quit plan. There are many free apps for mobile phones and tablets as well as websites. Examples include Quit Guide from the CDC and smokefree.gov  What things can I do to make it easier to quit?  Talk to your family and friends. Ask them to support and encourage you. Call a phone quitline (1-800-QUIT-NOW), reach out to support groups, or work with a counselor. Ask people who smoke to not smoke around you. Avoid places that make you want to smoke, such as: Bars. Parties. Smoke-break areas at work. Spend time with people who do not smoke. Lower the stress in your life. Stress can make you want to   smoke. Try these things to help your stress: Getting regular exercise. Doing deep-breathing exercises. Doing yoga. Meditating. Doing a body scan. To do this, close your eyes, focus on one area of your body at a time from head to toe. Notice which parts of your body are tense. Try to relax the muscles in those areas. How will I feel when I quit smoking? Day 1 to 3 weeks Within the first 24 hours, you may start to have some problems that come from quitting tobacco.  These problems are very bad 2-3 days after you quit, but they do not often last for more than 2-3 weeks. You may get these symptoms: Mood swings. Feeling restless, nervous, angry, or annoyed. Trouble concentrating. Dizziness. Strong desire for high-sugar foods and nicotine. Weight gain. Trouble pooping (constipation). Feeling like you may vomit (nausea). Coughing or a sore throat. Changes in how the medicines that you take for other issues work in your body. Depression. Trouble sleeping (insomnia). Week 3 and afterward After the first 2-3 weeks of quitting, you may start to notice more positive results, such as: Better sense of smell and taste. Less coughing and sore throat. Slower heart rate. Lower blood pressure. Clearer skin. Better breathing. Fewer sick days. Quitting smoking can be hard. Do not give up if you fail the first time. Some people need to try a few times before they succeed. Do your best to stick to your quit plan, and talk with your doctor if you have any questions or concerns. Summary Smoking tobacco is the leading cause of preventable death. Quitting smoking can be hard, but it is one of the best things that you can do for your health. When you decide to quit smoking, make a plan to help you succeed. Quit smoking right away, not slowly over a period of time. When you start quitting, seek help from your doctor, family, or friends. This information is not intended to replace advice given to you by your health care provider. Make sure you discuss any questions you have with your health care provider. Document Revised: 02/16/2021 Document Reviewed: 08/29/2018 Elsevier Patient Education  2022 Elsevier Inc.  

## 2021-05-28 ENCOUNTER — Other Ambulatory Visit (HOSPITAL_COMMUNITY): Payer: Self-pay | Admitting: Internal Medicine

## 2021-05-28 DIAGNOSIS — Z1231 Encounter for screening mammogram for malignant neoplasm of breast: Secondary | ICD-10-CM

## 2021-06-07 ENCOUNTER — Ambulatory Visit (HOSPITAL_COMMUNITY)
Admission: RE | Admit: 2021-06-07 | Discharge: 2021-06-07 | Disposition: A | Discharge status: Home / Self Care | Payer: PPO | Source: Ambulatory Visit | Attending: Internal Medicine | Admitting: Internal Medicine

## 2021-06-07 ENCOUNTER — Other Ambulatory Visit: Payer: Self-pay

## 2021-06-07 DIAGNOSIS — Z1231 Encounter for screening mammogram for malignant neoplasm of breast: Secondary | ICD-10-CM | POA: Insufficient documentation

## 2021-07-25 DIAGNOSIS — L570 Actinic keratosis: Secondary | ICD-10-CM | POA: Diagnosis not present

## 2021-07-31 ENCOUNTER — Telehealth: Payer: Self-pay

## 2021-07-31 MED ORDER — PREDNISONE 10 MG (21) PO TBPK: ORAL_TABLET | ORAL | 1 refills | Status: DC

## 2021-07-31 NOTE — Telephone Encounter (Signed)
Pt called stating she needs a prednisone dose pack. She states that she needs the 10mg  please  [facility]

## 2021-08-10 DIAGNOSIS — R7301 Impaired fasting glucose: Secondary | ICD-10-CM | POA: Diagnosis not present

## 2021-08-10 DIAGNOSIS — E782 Mixed hyperlipidemia: Secondary | ICD-10-CM | POA: Diagnosis not present

## 2021-08-13 DIAGNOSIS — H2513 Age-related nuclear cataract, bilateral: Secondary | ICD-10-CM | POA: Diagnosis not present

## 2021-08-13 DIAGNOSIS — H524 Presbyopia: Secondary | ICD-10-CM | POA: Diagnosis not present

## 2021-08-13 DIAGNOSIS — H04123 Dry eye syndrome of bilateral lacrimal glands: Secondary | ICD-10-CM | POA: Diagnosis not present

## 2021-08-15 DIAGNOSIS — J449 Chronic obstructive pulmonary disease, unspecified: Secondary | ICD-10-CM | POA: Diagnosis not present

## 2021-08-15 DIAGNOSIS — M79671 Pain in right foot: Secondary | ICD-10-CM | POA: Diagnosis not present

## 2021-08-15 DIAGNOSIS — R7301 Impaired fasting glucose: Secondary | ICD-10-CM | POA: Diagnosis not present

## 2021-08-15 DIAGNOSIS — E782 Mixed hyperlipidemia: Secondary | ICD-10-CM | POA: Diagnosis not present

## 2021-08-15 DIAGNOSIS — F17290 Nicotine dependence, other tobacco product, uncomplicated: Secondary | ICD-10-CM | POA: Diagnosis not present

## 2021-08-15 DIAGNOSIS — L989 Disorder of the skin and subcutaneous tissue, unspecified: Secondary | ICD-10-CM | POA: Diagnosis not present

## 2021-08-15 DIAGNOSIS — E669 Obesity, unspecified: Secondary | ICD-10-CM | POA: Diagnosis not present

## 2021-08-15 DIAGNOSIS — M25561 Pain in right knee: Secondary | ICD-10-CM | POA: Diagnosis not present

## 2021-08-15 DIAGNOSIS — F419 Anxiety disorder, unspecified: Secondary | ICD-10-CM | POA: Diagnosis not present

## 2021-08-16 ENCOUNTER — Ambulatory Visit: Payer: PPO | Admitting: Orthopaedic Surgery

## 2021-08-16 ENCOUNTER — Other Ambulatory Visit: Payer: Self-pay

## 2021-08-16 ENCOUNTER — Encounter: Payer: Self-pay | Admitting: Orthopaedic Surgery

## 2021-08-16 VITALS — BP 149/82 | HR 88 | Ht 62.0 in | Wt 150.0 lb

## 2021-08-16 DIAGNOSIS — M545 Low back pain, unspecified: Secondary | ICD-10-CM

## 2021-08-16 DIAGNOSIS — G8929 Other chronic pain: Secondary | ICD-10-CM | POA: Diagnosis not present

## 2021-08-16 NOTE — Progress Notes (Signed)
My back pain comes and goes.  She has good and bad days with her lower back pain.  She has no new trauma, no weakness.  Cold days and rainy days are worse.  She has been active.  She is taking her medicine.  Spine/Pelvis examination:  Inspection:  Overall, sacoiliac joint benign and hips nontender; without crepitus or defects.   Thoracic spine inspection: Alignment normal without kyphosis present   Lumbar spine inspection:  Alignment  with normal lumbar lordosis, without scoliosis apparent.   Thoracic spine palpation:  without tenderness of spinal processes   Lumbar spine palpation: without tenderness of lumbar area; without tightness of lumbar muscles    Range of Motion:   Lumbar flexion, forward flexion is normal without pain or tenderness    Lumbar extension is full without pain or tenderness   Left lateral bend is normal without pain or tenderness   Right lateral bend is normal without pain or tenderness   Straight leg raising is normal  Strength & tone: normal   Stability overall normal stability  Encounter Diagnosis  Name Primary?   Chronic midline low back pain without sciatica Yes   Continue exercises and activity.  Return in three months.  Call if any problem.  Precautions discussed.  Electronically Signed Darreld Mclean, MD 2/23/20238:07 AM

## 2021-08-23 ENCOUNTER — Ambulatory Visit: Payer: PPO | Admitting: Orthopaedic Surgery

## 2021-08-30 ENCOUNTER — Ambulatory Visit: Payer: PPO | Admitting: Orthopaedic Surgery

## 2021-09-14 DIAGNOSIS — G4709 Other insomnia: Secondary | ICD-10-CM | POA: Diagnosis not present

## 2021-10-09 DIAGNOSIS — L570 Actinic keratosis: Secondary | ICD-10-CM | POA: Diagnosis not present

## 2021-10-12 DIAGNOSIS — K5903 Drug induced constipation: Secondary | ICD-10-CM | POA: Diagnosis not present

## 2021-10-23 ENCOUNTER — Encounter: Payer: Self-pay | Admitting: Orthopaedic Surgery

## 2021-10-23 ENCOUNTER — Ambulatory Visit: Payer: PPO | Admitting: Orthopaedic Surgery

## 2021-10-23 DIAGNOSIS — G8929 Other chronic pain: Secondary | ICD-10-CM

## 2021-10-23 DIAGNOSIS — F17210 Nicotine dependence, cigarettes, uncomplicated: Secondary | ICD-10-CM

## 2021-10-23 DIAGNOSIS — M545 Low back pain, unspecified: Secondary | ICD-10-CM

## 2021-10-23 MED ORDER — HYDROCODONE-ACETAMINOPHEN 5-325 MG PO TABS: ORAL_TABLET | ORAL | 0 refills | Status: DC

## 2021-10-23 NOTE — Progress Notes (Signed)
I am sore ? ?She has had more pain in the back with no radiation.  The Ultram is not working as it used to.  She has no weakness, no new trauma.  She is active.  She has more bad days now. ? ?Spine/Pelvis examination: ? Inspection:  Overall, sacoiliac joint benign and hips nontender; without crepitus or defects. ? ? Thoracic spine inspection: Alignment normal without kyphosis present ? ? Lumbar spine inspection:  Alignment  with normal lumbar lordosis, without scoliosis apparent. ? ? Thoracic spine palpation:  without tenderness of spinal processes ? ? Lumbar spine palpation: without tenderness of lumbar area; without tightness of lumbar muscles  ? ? Range of Motion: ?  Lumbar flexion, forward flexion is normal without pain or tenderness  ?  Lumbar extension is full without pain or tenderness ?  Left lateral bend is normal without pain or tenderness ?  Right lateral bend is normal without pain or tenderness ?  Straight leg raising is normal ? Strength & tone: normal ? ? Stability overall normal stability ? ?Encounter Diagnoses  ?Name Primary?  ? Chronic midline low back pain without sciatica Yes  ? Nicotine dependence, cigarettes, uncomplicated   ? ?I will change to Norco. ? ?I have reviewed the [facility] web site prior to prescribing narcotic medicine for this patient. ? ?Return in three months. ? ?Call if any problem. ? ?Precautions discussed. ? ?Electronically Signed ?Sanjuana Kava, MD ?5/2/20238:10 AM ? ?

## 2021-10-26 ENCOUNTER — Telehealth: Payer: Self-pay | Admitting: Orthopaedic Surgery

## 2021-10-26 NOTE — Telephone Encounter (Signed)
Patient called to relay that the medication prescribed at her office visit 10/23/21 is working; states she is tolerating it; asking for refill "for the remainder of prescription" ?  ?HYDROcodone-acetaminophen (NORCO/VICODIN) 5-325 MG tablet 30 tablet  ?     [facility] ?

## 2021-10-29 MED ORDER — HYDROCODONE-ACETAMINOPHEN 5-325 MG PO TABS: 1.0000 | ORAL_TABLET | Freq: Four times a day (QID) | ORAL | 0 refills | Status: AC | PRN

## 2021-10-30 ENCOUNTER — Ambulatory Visit: Payer: PPO | Admitting: Orthopaedic Surgery

## 2021-12-04 ENCOUNTER — Ambulatory Visit: Payer: PPO | Admitting: Orthopaedic Surgery

## 2021-12-04 ENCOUNTER — Ambulatory Visit (INDEPENDENT_AMBULATORY_CARE_PROVIDER_SITE_OTHER): Payer: PPO

## 2021-12-04 ENCOUNTER — Encounter: Payer: Self-pay | Admitting: Orthopaedic Surgery

## 2021-12-04 VITALS — BP 163/91 | HR 98 | Ht 62.0 in | Wt 152.0 lb

## 2021-12-04 DIAGNOSIS — M545 Low back pain, unspecified: Secondary | ICD-10-CM

## 2021-12-04 DIAGNOSIS — G8929 Other chronic pain: Secondary | ICD-10-CM

## 2021-12-04 MED ORDER — PREDNISONE 5 MG (21) PO TBPK: ORAL_TABLET | ORAL | 0 refills | Status: DC

## 2021-12-04 MED ORDER — HYDROCODONE-ACETAMINOPHEN 5-325 MG PO TABS: 1.0000 | ORAL_TABLET | Freq: Four times a day (QID) | ORAL | 0 refills | Status: AC | PRN

## 2021-12-04 NOTE — Progress Notes (Signed)
My back is hurting bad.  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  She worked on a Thursday and then a Saturday.  Then she came home and worked in the yard.  Sunday May 28 she was in marked pain.  She has pain in the lower back, left side radiating to the buttock but not beyond.  She is a little better but still has lower back pain.  She has taken Norco, Flexeril, used heat and ice but still has pain.  She has no numbness.   Spine/Pelvis examination:  Inspection:  Overall, sacoiliac joint benign and hips nontender; without crepitus or defects.   Thoracic spine inspection: Alignment normal without kyphosis present   Lumbar spine inspection:  Alignment  without normal lumbar lordosis, with scoliosis apparent.   Thoracic spine palpation:  without tenderness of spinal processes   Lumbar spine palpation: with tenderness of lumbar area; with tightness of lumbar muscles    Range of Motion:   Lumbar flexion, forward flexion is 15 with pain or tenderness    Lumbar extension is 5 with pain or tenderness   Left lateral bend is Abnormal- 10   with pain or tenderness   Right lateral bend is Abnormal- 10  with pain or tenderness   Straight leg raising is Normal   Strength & tone: Normal   Stability overall normal stability  X-rays were done of the lumbar and thoracic spine, reported separately.  Encounter Diagnosis  Name Primary?   Chronic midline low back pain without sciatica Yes   I am concerned about HNP.  I will get MRI.  I will give refill of pain medicine.  Continue the Flexeril.  I will give Rx for prednisone dose pack.  Return on July 11.  I have reviewed the [place] Controlled Substance Reporting System web site prior to prescribing narcotic medicine for this patient.  Call if any problem.  Precautions discussed.  Electronically Signed Darreld Mclean, MD 6/13/20239:17 AM

## 2021-12-04 NOTE — Addendum Note (Signed)
Addended by: Derek Mound A on: 12/04/2021 11:27 AM   Modules accepted: Orders

## 2021-12-12 ENCOUNTER — Telehealth: Payer: Self-pay

## 2021-12-12 MED ORDER — PREDNISONE 5 MG (21) PO TBPK
ORAL_TABLET | ORAL | 0 refills | Status: DC
Start: 2021-12-12 — End: 2022-01-01

## 2021-12-12 NOTE — Telephone Encounter (Signed)
Patient states that the 5 mg steroid that you prescribed her isn't helping. States she usually gets the 10 mg.Asking if you will stop the 5 mg and send in the 10 mg so she can get some relief.   PATIENT USES [facility]

## 2021-12-20 DIAGNOSIS — M4807 Spinal stenosis, lumbosacral region: Secondary | ICD-10-CM | POA: Diagnosis not present

## 2021-12-20 DIAGNOSIS — M8448XA Pathological fracture, other site, initial encounter for fracture: Secondary | ICD-10-CM | POA: Diagnosis not present

## 2021-12-20 DIAGNOSIS — M47816 Spondylosis without myelopathy or radiculopathy, lumbar region: Secondary | ICD-10-CM | POA: Diagnosis not present

## 2021-12-20 DIAGNOSIS — M419 Scoliosis, unspecified: Secondary | ICD-10-CM | POA: Diagnosis not present

## 2021-12-20 DIAGNOSIS — M48061 Spinal stenosis, lumbar region without neurogenic claudication: Secondary | ICD-10-CM | POA: Diagnosis not present

## 2021-12-20 DIAGNOSIS — M4317 Spondylolisthesis, lumbosacral region: Secondary | ICD-10-CM | POA: Diagnosis not present

## 2021-12-20 DIAGNOSIS — M47817 Spondylosis without myelopathy or radiculopathy, lumbosacral region: Secondary | ICD-10-CM | POA: Diagnosis not present

## 2022-01-01 ENCOUNTER — Ambulatory Visit (INDEPENDENT_AMBULATORY_CARE_PROVIDER_SITE_OTHER): Payer: PPO | Admitting: Orthopaedic Surgery

## 2022-01-01 ENCOUNTER — Encounter: Payer: Self-pay | Admitting: Orthopaedic Surgery

## 2022-01-01 DIAGNOSIS — M545 Low back pain, unspecified: Secondary | ICD-10-CM | POA: Diagnosis not present

## 2022-01-01 DIAGNOSIS — M8448XA Pathological fracture, other site, initial encounter for fracture: Secondary | ICD-10-CM

## 2022-01-01 DIAGNOSIS — F17210 Nicotine dependence, cigarettes, uncomplicated: Secondary | ICD-10-CM

## 2022-01-01 DIAGNOSIS — G8929 Other chronic pain: Secondary | ICD-10-CM

## 2022-01-01 NOTE — Progress Notes (Signed)
I feel a little better.  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  I have explained the sacral insufficiency fracture.  I will get a bone density study.  I have told her about taking Vitamin D3.  Encounter Diagnoses  Name Primary?   Sacral insufficiency fracture, initial encounter Yes   Chronic midline low back pain without sciatica    Nicotine dependence, cigarettes, uncomplicated    Return in three weeks.  Get the bone density study, begin Vit. D.  Call if any problem.  Precautions discussed.  Electronically Signed Darreld Mclean, MD 7/11/202311:26 AM

## 2022-01-10 DIAGNOSIS — M810 Age-related osteoporosis without current pathological fracture: Secondary | ICD-10-CM | POA: Diagnosis not present

## 2022-01-23 ENCOUNTER — Encounter: Payer: Self-pay | Admitting: Orthopaedic Surgery

## 2022-01-23 ENCOUNTER — Ambulatory Visit: Payer: PPO | Admitting: Orthopaedic Surgery

## 2022-01-23 DIAGNOSIS — M8448XA Pathological fracture, other site, initial encounter for fracture: Secondary | ICD-10-CM

## 2022-01-23 DIAGNOSIS — M545 Low back pain, unspecified: Secondary | ICD-10-CM

## 2022-01-23 DIAGNOSIS — F17210 Nicotine dependence, cigarettes, uncomplicated: Secondary | ICD-10-CM

## 2022-01-23 DIAGNOSIS — G8929 Other chronic pain: Secondary | ICD-10-CM

## 2022-01-23 MED ORDER — HYDROCODONE-ACETAMINOPHEN 5-325 MG PO TABS: 1.0000 | ORAL_TABLET | Freq: Four times a day (QID) | ORAL | 0 refills | Status: DC | PRN

## 2022-01-23 NOTE — Progress Notes (Signed)
I feel better.  Her lower back pain is less today.  She had her bone density study done showing osteoporosis, more of the lumbar spine and hips.  I went over the report with her.  She is taking supplemental vitamin D and calcium.  Her mother had severe osteoporosis.  NV intact.  Her back is not hurting, ROM is good.  Encounter Diagnoses  Name Primary?   Sacral insufficiency fracture, initial encounter Yes   Nicotine dependence, cigarettes, uncomplicated    Chronic midline low back pain without sciatica    I will see her in three months.  I have reviewed the [place] Controlled Substance Reporting System web site prior to prescribing narcotic medicine for this patient.  Call if any problem.  Precautions discussed.  Electronically Signed Darreld Mclean, MD 8/2/20238:09 AM

## 2022-01-24 ENCOUNTER — Ambulatory Visit: Payer: PPO | Admitting: Orthopaedic Surgery

## 2022-02-06 DIAGNOSIS — E782 Mixed hyperlipidemia: Secondary | ICD-10-CM | POA: Diagnosis not present

## 2022-02-06 DIAGNOSIS — R7301 Impaired fasting glucose: Secondary | ICD-10-CM | POA: Diagnosis not present

## 2022-02-13 DIAGNOSIS — M8008XD Age-related osteoporosis with current pathological fracture, vertebra(e), subsequent encounter for fracture with routine healing: Secondary | ICD-10-CM | POA: Diagnosis not present

## 2022-02-13 DIAGNOSIS — F419 Anxiety disorder, unspecified: Secondary | ICD-10-CM | POA: Diagnosis not present

## 2022-02-13 DIAGNOSIS — M79671 Pain in right foot: Secondary | ICD-10-CM | POA: Diagnosis not present

## 2022-02-13 DIAGNOSIS — I10 Essential (primary) hypertension: Secondary | ICD-10-CM | POA: Diagnosis not present

## 2022-02-13 DIAGNOSIS — M810 Age-related osteoporosis without current pathological fracture: Secondary | ICD-10-CM | POA: Diagnosis not present

## 2022-02-13 DIAGNOSIS — M25561 Pain in right knee: Secondary | ICD-10-CM | POA: Diagnosis not present

## 2022-02-13 DIAGNOSIS — E782 Mixed hyperlipidemia: Secondary | ICD-10-CM | POA: Diagnosis not present

## 2022-02-13 DIAGNOSIS — R7301 Impaired fasting glucose: Secondary | ICD-10-CM | POA: Diagnosis not present

## 2022-02-13 DIAGNOSIS — E669 Obesity, unspecified: Secondary | ICD-10-CM | POA: Diagnosis not present

## 2022-02-13 DIAGNOSIS — L989 Disorder of the skin and subcutaneous tissue, unspecified: Secondary | ICD-10-CM | POA: Diagnosis not present

## 2022-02-13 DIAGNOSIS — J449 Chronic obstructive pulmonary disease, unspecified: Secondary | ICD-10-CM | POA: Diagnosis not present

## 2022-02-13 DIAGNOSIS — F17290 Nicotine dependence, other tobacco product, uncomplicated: Secondary | ICD-10-CM | POA: Diagnosis not present

## 2022-03-13 DIAGNOSIS — F17290 Nicotine dependence, other tobacco product, uncomplicated: Secondary | ICD-10-CM | POA: Diagnosis not present

## 2022-03-13 DIAGNOSIS — F419 Anxiety disorder, unspecified: Secondary | ICD-10-CM | POA: Diagnosis not present

## 2022-03-13 DIAGNOSIS — I10 Essential (primary) hypertension: Secondary | ICD-10-CM | POA: Diagnosis not present

## 2022-03-13 DIAGNOSIS — M810 Age-related osteoporosis without current pathological fracture: Secondary | ICD-10-CM | POA: Diagnosis not present

## 2022-03-13 DIAGNOSIS — J449 Chronic obstructive pulmonary disease, unspecified: Secondary | ICD-10-CM | POA: Diagnosis not present

## 2022-03-13 DIAGNOSIS — E669 Obesity, unspecified: Secondary | ICD-10-CM | POA: Diagnosis not present

## 2022-03-13 DIAGNOSIS — M8008XD Age-related osteoporosis with current pathological fracture, vertebra(e), subsequent encounter for fracture with routine healing: Secondary | ICD-10-CM | POA: Diagnosis not present

## 2022-03-26 ENCOUNTER — Telehealth: Payer: Self-pay | Admitting: Orthopaedic Surgery

## 2022-03-26 NOTE — Telephone Encounter (Signed)
Patient requests refill:  HYDROcodone-acetaminophen (NORCO/VICODIN) 5-325 MG tablet 100 tablet       [facility]

## 2022-03-26 NOTE — Telephone Encounter (Signed)
Spoke with patient this morning and sent the first request message/phone note to Dr Luna Glasgow as requested. Patient next asked for Ellen Reynolds, please return call. Phone: [phone]

## 2022-03-26 NOTE — Telephone Encounter (Signed)
I called patient Ellen Reynolds to call me back.

## 2022-04-25 ENCOUNTER — Encounter: Payer: Self-pay | Admitting: Orthopaedic Surgery

## 2022-04-25 ENCOUNTER — Ambulatory Visit: Payer: PPO | Admitting: Orthopaedic Surgery

## 2022-04-25 VITALS — BP 163/81 | HR 98 | Ht 60.0 in | Wt 156.0 lb

## 2022-04-25 DIAGNOSIS — M545 Low back pain, unspecified: Secondary | ICD-10-CM | POA: Diagnosis not present

## 2022-04-25 DIAGNOSIS — G8929 Other chronic pain: Secondary | ICD-10-CM | POA: Diagnosis not present

## 2022-04-25 NOTE — Patient Instructions (Signed)
Your next visit can be a virtual visit where he calls you if nothing has changed.

## 2022-04-25 NOTE — Progress Notes (Signed)
I am about the same.  She has chronic lower back pain.  She has good and bad days.  She has no new trauma, no weakness.  She is active and taking her medicine.  Spine/Pelvis examination:  Inspection:  Overall, sacoiliac joint benign and hips nontender; without crepitus or defects.   Thoracic spine inspection: Alignment normal without kyphosis present   Lumbar spine inspection:  Alignment  with normal lumbar lordosis, without scoliosis apparent.   Thoracic spine palpation:  without tenderness of spinal processes   Lumbar spine palpation: without tenderness of lumbar area; without tightness of lumbar muscles    Range of Motion:   Lumbar flexion, forward flexion is normal without pain or tenderness    Lumbar extension is full without pain or tenderness   Left lateral bend is normal without pain or tenderness   Right lateral bend is normal without pain or tenderness   Straight leg raising is normal  Strength & tone: normal   Stability overall normal stability  Encounter Diagnosis  Name Primary?   Chronic midline low back pain without sciatica Yes   I will see her in three months.  It can be done virtually.  Call if any problem.  Precautions discussed.  Electronically Signed Sanjuana Kava, MD 11/2/20238:37 AM

## 2022-04-30 DIAGNOSIS — Q828 Other specified congenital malformations of skin: Secondary | ICD-10-CM | POA: Diagnosis not present

## 2022-04-30 DIAGNOSIS — M79671 Pain in right foot: Secondary | ICD-10-CM | POA: Diagnosis not present

## 2022-05-06 ENCOUNTER — Other Ambulatory Visit (HOSPITAL_COMMUNITY): Payer: Self-pay | Admitting: Internal Medicine

## 2022-05-06 DIAGNOSIS — Z1231 Encounter for screening mammogram for malignant neoplasm of breast: Secondary | ICD-10-CM

## 2022-05-21 ENCOUNTER — Telehealth: Payer: Self-pay | Admitting: Orthopaedic Surgery

## 2022-06-04 ENCOUNTER — Telehealth: Payer: Self-pay | Admitting: Orthopaedic Surgery

## 2022-06-04 DIAGNOSIS — Q828 Other specified congenital malformations of skin: Secondary | ICD-10-CM | POA: Diagnosis not present

## 2022-06-04 DIAGNOSIS — M79671 Pain in right foot: Secondary | ICD-10-CM | POA: Diagnosis not present

## 2022-06-04 NOTE — Telephone Encounter (Signed)
Patient called and left a voicemail stating she wants another dose pack of prednisone.   Patient said she wants 10 mg not 5 mg   Pharmacy:  [facility]

## 2022-06-05 MED ORDER — PREDNISONE 10 MG (21) PO TBPK: ORAL_TABLET | ORAL | 1 refills | Status: DC

## 2022-06-12 ENCOUNTER — Ambulatory Visit (HOSPITAL_COMMUNITY): Payer: PPO

## 2022-06-13 ENCOUNTER — Ambulatory Visit (HOSPITAL_COMMUNITY)
Admission: RE | Admit: 2022-06-13 | Discharge: 2022-06-13 | Disposition: A | Discharge status: Home / Self Care | Payer: PPO | Source: Ambulatory Visit | Attending: Internal Medicine | Admitting: Internal Medicine

## 2022-06-13 DIAGNOSIS — Z1231 Encounter for screening mammogram for malignant neoplasm of breast: Secondary | ICD-10-CM | POA: Insufficient documentation

## 2022-07-09 DIAGNOSIS — M79671 Pain in right foot: Secondary | ICD-10-CM | POA: Diagnosis not present

## 2022-07-09 DIAGNOSIS — Q828 Other specified congenital malformations of skin: Secondary | ICD-10-CM | POA: Diagnosis not present

## 2022-07-16 ENCOUNTER — Ambulatory Visit: Payer: Medicare HMO | Admitting: Orthopaedic Surgery

## 2022-07-16 ENCOUNTER — Telehealth: Payer: Self-pay | Admitting: Orthopaedic Surgery

## 2022-07-16 ENCOUNTER — Encounter: Payer: Self-pay | Admitting: Orthopaedic Surgery

## 2022-07-16 DIAGNOSIS — R69 Illness, unspecified: Secondary | ICD-10-CM | POA: Diagnosis not present

## 2022-07-16 DIAGNOSIS — F17210 Nicotine dependence, cigarettes, uncomplicated: Secondary | ICD-10-CM

## 2022-07-16 DIAGNOSIS — M810 Age-related osteoporosis without current pathological fracture: Secondary | ICD-10-CM

## 2022-07-16 NOTE — Progress Notes (Signed)
My tooth broke.  She had a broken tooth. She would like a new Dexa Scan.  Osteoporosis was noted on prior scan and runs in the family.  She is concerned it is worse.  She has no pains of the bones or joints other than some occasional aches.  She has no other tooth problems.  Encounter Diagnoses  Name Primary?   Post-menopausal osteoporosis Yes   Nicotine dependence, cigarettes, uncomplicated    I will see her in three weeks.  She has dental appointment on Feb 12.  Call if any problem.  Precautions discussed.  Electronically Signed Sanjuana Kava, MD 1/23/20249:56 AM

## 2022-07-16 NOTE — Patient Instructions (Addendum)
I WILL FAX YOUR REFERRAL TO [facility] BUT THE END OF THE DAY TOMORROW.   ROV 3 WEEKS BONE DENSITY REVIEW   Steps to Quit Smoking Smoking tobacco is the leading cause of preventable death. It can affect almost every organ in the body. Smoking puts you and people around you at risk for many serious, long-lasting (chronic) diseases. Quitting smoking can be hard, but it is one of the best things that you can do for your health. It is never too late to quit. Do not give up if you cannot quit the first time. Some people need to try many times to quit. Do your best to stick to your quit plan, and talk with your doctor if you have any questions or concerns. How do I get ready to quit? Pick a date to quit. Set a date within the next 2 weeks to give you time to prepare. Write down the reasons why you are quitting. Keep this list in places where you will see it often. Tell your family, friends, and co-workers that you are quitting. Their support is important. Talk with your doctor about the choices that may help you quit. Find out if your health insurance will pay for these treatments. Know the people, places, things, and activities that make you want to smoke (triggers). Avoid them. What first steps can I take to quit smoking? Throw away all cigarettes at home, at work, and in your car. Throw away the things that you use when you smoke, such as ashtrays and lighters. Clean your car. Empty the ashtray. Clean your home, including curtains and carpets. What can I do to help me quit smoking? Talk with your doctor about taking medicines and seeing a counselor. You are more likely to succeed when you do both. If you are pregnant or breastfeeding: Talk with your doctor about counseling or other ways to quit smoking. Do not take medicine to help you quit smoking unless your doctor tells you to. Quit right away Quit smoking completely, instead of slowly cutting back on how much you smoke over a period of  time. Stopping smoking right away may be more successful than slowly quitting. Go to counseling. In-person is best if this is an option. You are more likely to quit if you go to counseling sessions regularly. Take medicine You may take medicines to help you quit. Some medicines need a prescription, and some you can buy over-the-counter. Some medicines may contain a drug called nicotine to replace the nicotine in cigarettes. Medicines may: Help you stop having the desire to smoke (cravings). Help to stop the problems that come when you stop smoking (withdrawal symptoms). Your doctor may ask you to use: Nicotine patches, gum, or lozenges. Nicotine inhalers or sprays. Non-nicotine medicine that you take by mouth. Find resources Find resources and other ways to help you quit smoking and remain smoke-free after you quit. They include: Online chats with a Social worker. Phone quitlines. Printed Furniture conservator/restorer. Support groups or group counseling. Text messaging programs. Mobile phone apps. Use apps on your mobile phone or tablet that can help you stick to your quit plan. Examples of free services include Quit Guide from the CDC and smokefree.gov  What can I do to make it easier to quit?  Talk to your family and friends. Ask them to support and encourage you. Call a phone quitline, such as 1-800-QUIT-NOW, reach out to support groups, or work with a Social worker. Ask people who smoke to not smoke around you.  Avoid places that make you want to smoke, such as: Bars. Parties. Smoke-break areas at work. Spend time with people who do not smoke. Lower the stress in your life. Stress can make you want to smoke. Try these things to lower stress: Getting regular exercise. Doing deep-breathing exercises. Doing yoga. Meditating. What benefits will I see if I quit smoking? Over time, you may have: A better sense of smell and taste. Less coughing and sore throat. A slower heart rate. Lower blood  pressure. Clearer skin. Better breathing. Fewer sick days. Summary Quitting smoking can be hard, but it is one of the best things that you can do for your health. Do not give up if you cannot quit the first time. Some people need to try many times to quit. When you decide to quit smoking, make a plan to help you succeed. Quit smoking right away, not slowly over a period of time. When you start quitting, get help and support to keep you smoke-free. This information is not intended to replace advice given to you by your health care provider. Make sure you discuss any questions you have with your health care provider. Document Revised: 06/01/2021 Document Reviewed: 06/01/2021 Elsevier Patient Education  [place].

## 2022-07-17 ENCOUNTER — Other Ambulatory Visit: Payer: Self-pay

## 2022-07-17 DIAGNOSIS — J069 Acute upper respiratory infection, unspecified: Secondary | ICD-10-CM | POA: Diagnosis not present

## 2022-07-17 DIAGNOSIS — R059 Cough, unspecified: Secondary | ICD-10-CM | POA: Diagnosis not present

## 2022-07-17 DIAGNOSIS — M810 Age-related osteoporosis without current pathological fracture: Secondary | ICD-10-CM

## 2022-07-17 NOTE — Progress Notes (Unsigned)
PATIENT REQUESTED TO HAVE ANOTHER BONE DENSITY/DEXA SCAN FOR OSTEOPOROSIS. SHE RECENTLY HAD ONE COMPLETED ON 01/10/2022 AT [facility] IN [place]. ADVISED PATIENT DURING HER APPT YESTERDAY INSURANCE USUALLY ONLY COVERS THIS SCAN ONCE EVERY 2 YEARS BUT I WOULD ORDER AND FAX PER PROVIDER.   RECEIVED FAX FROM [facility] STATING PATIENT HAD ONE 12/2021 AND WAS NOT DUE UNTIL 2025.

## 2022-07-19 DIAGNOSIS — J441 Chronic obstructive pulmonary disease with (acute) exacerbation: Secondary | ICD-10-CM | POA: Diagnosis not present

## 2022-07-19 DIAGNOSIS — R059 Cough, unspecified: Secondary | ICD-10-CM | POA: Diagnosis not present

## 2022-07-23 ENCOUNTER — Telehealth: Payer: Self-pay

## 2022-07-23 DIAGNOSIS — Z6829 Body mass index (BMI) 29.0-29.9, adult: Secondary | ICD-10-CM | POA: Diagnosis not present

## 2022-07-23 DIAGNOSIS — Z20822 Contact with and (suspected) exposure to covid-19: Secondary | ICD-10-CM | POA: Diagnosis not present

## 2022-07-23 DIAGNOSIS — E663 Overweight: Secondary | ICD-10-CM | POA: Diagnosis not present

## 2022-07-23 DIAGNOSIS — J441 Chronic obstructive pulmonary disease with (acute) exacerbation: Secondary | ICD-10-CM | POA: Diagnosis not present

## 2022-07-23 NOTE — Telephone Encounter (Signed)
Patient called to see why she hasn't heard anything about her bone density test being scheduled.  I told her what I saw in her chart that [facility] had put in stating that [facility] 12/2021 AND WAS NOT DUE UNTIL 2025.   Patient is wanting you to tell her what she needs to do now and if she needs to still come in on 08/06/22 to see you.

## 2022-07-25 ENCOUNTER — Ambulatory Visit (INDEPENDENT_AMBULATORY_CARE_PROVIDER_SITE_OTHER): Payer: Medicare HMO | Admitting: Orthopaedic Surgery

## 2022-07-25 ENCOUNTER — Encounter: Payer: Self-pay | Admitting: Orthopaedic Surgery

## 2022-07-25 DIAGNOSIS — M810 Age-related osteoporosis without current pathological fracture: Secondary | ICD-10-CM

## 2022-07-25 NOTE — Progress Notes (Signed)
Virtual Visit via Telephone Note  I connected with patient on 07/25/22 at 10:00 AM EST by telephone and verified that I am speaking with the correct person using two identifiers.  Location: Patient: home Provider: office   I discussed the limitations, risks, security and privacy concerns of performing an evaluation and management service by telephone and the availability of in person appointments. I also discussed with the patient that there may be a patient responsible charge related to this service. The patient expressed understanding and agreed to proceed.   History of Present Illness: She got sick after being here last week.  She was seen at Urgent Care.  She does not have COVID.  Dexa scan was denied as she had it in October.  She is to see dentist soon about her teeth.  I will hold off on dexa scan until she contacts me.   Observations/Objective: Per above.  Assessment and Plan: Encounter Diagnosis  Name Primary?   Post-menopausal osteoporosis Yes      Follow Up Instructions: To call   I discussed the assessment and treatment plan with the patient. The patient was provided an opportunity to ask questions and all were answered. The patient agreed with the plan and demonstrated an understanding of the instructions.   The patient was advised to call back or seek an in-person evaluation if the symptoms worsen or if the condition fails to improve as anticipated.  I provided 8 minutes of non-face-to-face time during this encounter.   Sanjuana Kava, MD

## 2022-08-06 ENCOUNTER — Ambulatory Visit: Payer: Medicare HMO | Admitting: Orthopaedic Surgery

## 2022-08-09 DIAGNOSIS — E782 Mixed hyperlipidemia: Secondary | ICD-10-CM | POA: Diagnosis not present

## 2022-08-09 DIAGNOSIS — R7301 Impaired fasting glucose: Secondary | ICD-10-CM | POA: Diagnosis not present

## 2022-08-13 DIAGNOSIS — M79672 Pain in left foot: Secondary | ICD-10-CM | POA: Diagnosis not present

## 2022-08-13 DIAGNOSIS — S93331D Other subluxation of right foot, subsequent encounter: Secondary | ICD-10-CM | POA: Diagnosis not present

## 2022-08-13 DIAGNOSIS — M79675 Pain in left toe(s): Secondary | ICD-10-CM | POA: Diagnosis not present

## 2022-08-13 DIAGNOSIS — M779 Enthesopathy, unspecified: Secondary | ICD-10-CM | POA: Diagnosis not present

## 2022-08-13 DIAGNOSIS — L110 Acquired keratosis follicularis: Secondary | ICD-10-CM | POA: Diagnosis not present

## 2022-08-13 DIAGNOSIS — I739 Peripheral vascular disease, unspecified: Secondary | ICD-10-CM | POA: Diagnosis not present

## 2022-08-13 DIAGNOSIS — M79671 Pain in right foot: Secondary | ICD-10-CM | POA: Diagnosis not present

## 2022-08-13 DIAGNOSIS — M79674 Pain in right toe(s): Secondary | ICD-10-CM | POA: Diagnosis not present

## 2022-08-15 DIAGNOSIS — F419 Anxiety disorder, unspecified: Secondary | ICD-10-CM | POA: Diagnosis not present

## 2022-08-15 DIAGNOSIS — M25561 Pain in right knee: Secondary | ICD-10-CM | POA: Diagnosis not present

## 2022-08-15 DIAGNOSIS — E669 Obesity, unspecified: Secondary | ICD-10-CM | POA: Diagnosis not present

## 2022-08-15 DIAGNOSIS — Z0001 Encounter for general adult medical examination with abnormal findings: Secondary | ICD-10-CM | POA: Diagnosis not present

## 2022-08-15 DIAGNOSIS — R7303 Prediabetes: Secondary | ICD-10-CM | POA: Diagnosis not present

## 2022-08-15 DIAGNOSIS — F17290 Nicotine dependence, other tobacco product, uncomplicated: Secondary | ICD-10-CM | POA: Diagnosis not present

## 2022-08-15 DIAGNOSIS — J449 Chronic obstructive pulmonary disease, unspecified: Secondary | ICD-10-CM | POA: Diagnosis not present

## 2022-08-15 DIAGNOSIS — M79671 Pain in right foot: Secondary | ICD-10-CM | POA: Diagnosis not present

## 2022-08-15 DIAGNOSIS — M8008XD Age-related osteoporosis with current pathological fracture, vertebra(e), subsequent encounter for fracture with routine healing: Secondary | ICD-10-CM | POA: Diagnosis not present

## 2022-08-15 DIAGNOSIS — R69 Illness, unspecified: Secondary | ICD-10-CM | POA: Diagnosis not present

## 2022-08-15 DIAGNOSIS — F17210 Nicotine dependence, cigarettes, uncomplicated: Secondary | ICD-10-CM | POA: Diagnosis not present

## 2022-08-15 DIAGNOSIS — I10 Essential (primary) hypertension: Secondary | ICD-10-CM | POA: Diagnosis not present

## 2022-08-15 DIAGNOSIS — E782 Mixed hyperlipidemia: Secondary | ICD-10-CM | POA: Diagnosis not present

## 2022-08-20 DIAGNOSIS — L821 Other seborrheic keratosis: Secondary | ICD-10-CM | POA: Diagnosis not present

## 2022-08-20 DIAGNOSIS — B079 Viral wart, unspecified: Secondary | ICD-10-CM | POA: Diagnosis not present

## 2022-08-20 DIAGNOSIS — D485 Neoplasm of uncertain behavior of skin: Secondary | ICD-10-CM | POA: Diagnosis not present

## 2022-08-20 DIAGNOSIS — Z1283 Encounter for screening for malignant neoplasm of skin: Secondary | ICD-10-CM | POA: Diagnosis not present

## 2022-09-03 ENCOUNTER — Encounter: Payer: Self-pay | Admitting: Orthopaedic Surgery

## 2022-09-03 ENCOUNTER — Ambulatory Visit: Payer: Medicare HMO | Admitting: Orthopaedic Surgery

## 2022-09-03 VITALS — BP 150/90 | HR 91 | Ht 62.0 in | Wt 160.0 lb

## 2022-09-03 DIAGNOSIS — M545 Low back pain, unspecified: Secondary | ICD-10-CM

## 2022-09-03 DIAGNOSIS — G8929 Other chronic pain: Secondary | ICD-10-CM | POA: Diagnosis not present

## 2022-09-03 NOTE — Patient Instructions (Addendum)
WE WILL LOOK INTO GETTING YOU FITTED FOR A LOWER LUMBAR BRACE. I WILL RESEARCH AND LET YOU KNOW AS SOON AS I CAN.  FOLLOW UP: AFTER YOU GET THE BACK BRACE

## 2022-09-03 NOTE — Progress Notes (Signed)
My back hurts.  She has chronic lower back pain.  She would like a lower back brace.  I do not normally Rx braces but she is an exception.  I will try to obtain one for her.  Spine/Pelvis examination:  Inspection:  Overall, sacoiliac joint benign and hips nontender; without crepitus or defects.   Thoracic spine inspection: Alignment normal without kyphosis present   Lumbar spine inspection:  Alignment  with normal lumbar lordosis, without scoliosis apparent.   Thoracic spine palpation:  without tenderness of spinal processes   Lumbar spine palpation: without tenderness of lumbar area; without tightness of lumbar muscles    Range of Motion:   Lumbar flexion, forward flexion is normal without pain or tenderness    Lumbar extension is full without pain or tenderness   Left lateral bend is normal without pain or tenderness   Right lateral bend is normal without pain or tenderness   Straight leg raising is normal  Strength & tone: normal   Stability overall normal stability  Encounter Diagnosis  Name Primary?   Chronic midline low back pain without sciatica Yes   We will attempt to find a brace for her. She will most likely need a custom brace.  Return after she gets the brace.  Call if any problem.  Precautions discussed.  Electronically Signed Sanjuana Kava, MD 3/12/202410:57 AM

## 2022-09-05 ENCOUNTER — Other Ambulatory Visit: Payer: Self-pay

## 2022-09-05 ENCOUNTER — Telehealth: Payer: Self-pay

## 2022-09-05 DIAGNOSIS — Z8781 Personal history of (healed) traumatic fracture: Secondary | ICD-10-CM

## 2022-09-05 DIAGNOSIS — M4156 Other secondary scoliosis, lumbar region: Secondary | ICD-10-CM

## 2022-09-05 DIAGNOSIS — M810 Age-related osteoporosis without current pathological fracture: Secondary | ICD-10-CM

## 2022-09-05 DIAGNOSIS — G8929 Other chronic pain: Secondary | ICD-10-CM

## 2022-09-05 NOTE — Telephone Encounter (Signed)
Spoke with patient. Advised her that [facility] in [place] can fit her for the customized Lumbar back brace. I have faxed the order information to them. Provided patient with number to call and schedule an appointment for fitting.

## 2022-09-12 ENCOUNTER — Ambulatory Visit: Payer: Medicare HMO | Admitting: Orthopaedic Surgery

## 2022-09-17 DIAGNOSIS — M79671 Pain in right foot: Secondary | ICD-10-CM | POA: Diagnosis not present

## 2022-09-17 DIAGNOSIS — M79672 Pain in left foot: Secondary | ICD-10-CM | POA: Diagnosis not present

## 2022-09-17 DIAGNOSIS — M79675 Pain in left toe(s): Secondary | ICD-10-CM | POA: Diagnosis not present

## 2022-09-17 DIAGNOSIS — M79674 Pain in right toe(s): Secondary | ICD-10-CM | POA: Diagnosis not present

## 2022-09-17 DIAGNOSIS — S93331D Other subluxation of right foot, subsequent encounter: Secondary | ICD-10-CM | POA: Diagnosis not present

## 2022-09-17 DIAGNOSIS — L110 Acquired keratosis follicularis: Secondary | ICD-10-CM | POA: Diagnosis not present

## 2022-09-17 DIAGNOSIS — M779 Enthesopathy, unspecified: Secondary | ICD-10-CM | POA: Diagnosis not present

## 2022-09-17 DIAGNOSIS — Q828 Other specified congenital malformations of skin: Secondary | ICD-10-CM | POA: Diagnosis not present

## 2022-09-25 ENCOUNTER — Telehealth: Payer: Self-pay | Admitting: Orthopaedic Surgery

## 2022-10-02 DIAGNOSIS — M419 Scoliosis, unspecified: Secondary | ICD-10-CM | POA: Diagnosis not present

## 2022-10-10 ENCOUNTER — Ambulatory Visit: Payer: Medicare HMO | Admitting: Orthopaedic Surgery

## 2022-10-10 ENCOUNTER — Encounter: Payer: Self-pay | Admitting: Orthopaedic Surgery

## 2022-10-10 VITALS — BP 150/96 | HR 102 | Ht 62.0 in | Wt 160.0 lb

## 2022-10-10 DIAGNOSIS — G8929 Other chronic pain: Secondary | ICD-10-CM | POA: Diagnosis not present

## 2022-10-10 DIAGNOSIS — M545 Low back pain, unspecified: Secondary | ICD-10-CM

## 2022-10-10 MED ORDER — PREDNISONE 10 MG (21) PO TBPK
ORAL_TABLET | ORAL | 1 refills | Status: DC
Start: 2022-10-10 — End: 2022-11-20

## 2022-10-10 NOTE — Progress Notes (Signed)
My brace does not work  She got a brace at [facility] and does not like it.  I told her to take it back or just accept that it did not work out for her.  She says it is uncomfortable and does not relieve her pain.  She does better with an occasional prednisone dose pack once or twice a year.  Spine/Pelvis examination:  Inspection:  Overall, sacoiliac joint benign and hips nontender; without crepitus or defects.   Thoracic spine inspection: Alignment normal without kyphosis present   Lumbar spine inspection:  Alignment  with normal lumbar lordosis, without scoliosis apparent.   Thoracic spine palpation:  without tenderness of spinal processes   Lumbar spine palpation: without tenderness of lumbar area; without tightness of lumbar muscles    Range of Motion:   Lumbar flexion, forward flexion is normal without pain or tenderness    Lumbar extension is full without pain or tenderness   Left lateral bend is normal without pain or tenderness   Right lateral bend is normal without pain or tenderness   Straight leg raising is normal  Strength & tone: normal   Stability overall normal stability  Encounter Diagnosis  Name Primary?   Chronic midline low back pain without sciatica Yes   I will call in prednisone dose pack.  Return in six weeks.  Call if any problem.  Precautions discussed.  Electronically Signed Darreld Mclean, MD 4/18/20248:45 AM

## 2022-10-10 NOTE — Patient Instructions (Addendum)
Call [facility] and ask them if they can fit you for a different type or if they will let you return the brace back.  [facility] [address] [place], [place] [address] Phone: [phone] Fax: [phone]  YOU HAVE BEEN PRESCRIBED ORAL PREDNISONE  DAY 1: TAKE 6 TABLETS BY MOUTH, TAKE THEM ALL AT ONE TIME DAY 2: TAKE 5 TABLETS BY MOUTH, TAKE THEM ALL AT ONE TIME DAY 3: TAKE 4 TABLETS BY MOUTH, TAKE THEM ALL AT ONE TIME DAY 4: TAKE 3 TABLETS BY MOUTH, TAKE THEM ALL AT ONE TIME DAY 5: TAKE 2 TABLETS BY MOUTH, TAKE THEM ALL AT ONE TIME DAY 6: TAKE 1 TABLET BY MOUTH  You must take the entire dose pack.

## 2022-10-22 ENCOUNTER — Telehealth: Payer: Self-pay | Admitting: Orthopaedic Surgery

## 2022-10-22 NOTE — Telephone Encounter (Signed)
Dr. Sanjuan Dame pt, Dr. Hilda Lias out of the office - spoke w/the patient, she received a summons for jury duty and would like a letter from Dr. Hilda Lias stating she physically can not do that.  She stated that she can not sit or stand that long.  Stated she's in the middle of a move and it has just about killed her.  [phone]

## 2022-10-29 ENCOUNTER — Encounter: Payer: Self-pay | Admitting: Orthopaedic Surgery

## 2022-10-29 ENCOUNTER — Telehealth: Payer: Self-pay

## 2022-10-29 NOTE — Telephone Encounter (Signed)
[name], did you do a note for Ellen Reynolds about jury duty?

## 2022-11-05 DIAGNOSIS — M79672 Pain in left foot: Secondary | ICD-10-CM | POA: Diagnosis not present

## 2022-11-05 DIAGNOSIS — S93331D Other subluxation of right foot, subsequent encounter: Secondary | ICD-10-CM | POA: Diagnosis not present

## 2022-11-05 DIAGNOSIS — M79675 Pain in left toe(s): Secondary | ICD-10-CM | POA: Diagnosis not present

## 2022-11-05 DIAGNOSIS — L110 Acquired keratosis follicularis: Secondary | ICD-10-CM | POA: Diagnosis not present

## 2022-11-05 DIAGNOSIS — M79671 Pain in right foot: Secondary | ICD-10-CM | POA: Diagnosis not present

## 2022-11-05 DIAGNOSIS — M79674 Pain in right toe(s): Secondary | ICD-10-CM | POA: Diagnosis not present

## 2022-11-05 DIAGNOSIS — M779 Enthesopathy, unspecified: Secondary | ICD-10-CM | POA: Diagnosis not present

## 2022-11-05 DIAGNOSIS — I739 Peripheral vascular disease, unspecified: Secondary | ICD-10-CM | POA: Diagnosis not present

## 2022-11-20 ENCOUNTER — Ambulatory Visit: Payer: Medicare HMO | Admitting: Orthopaedic Surgery

## 2022-11-20 ENCOUNTER — Encounter: Payer: Self-pay | Admitting: Orthopaedic Surgery

## 2022-11-20 VITALS — BP 163/79 | HR 85 | Ht 62.0 in | Wt 161.0 lb

## 2022-11-20 DIAGNOSIS — M545 Low back pain, unspecified: Secondary | ICD-10-CM

## 2022-11-20 DIAGNOSIS — G8929 Other chronic pain: Secondary | ICD-10-CM

## 2022-11-20 MED ORDER — PREDNISONE 10 MG (21) PO TBPK: ORAL_TABLET | ORAL | 1 refills | Status: DC

## 2022-11-20 NOTE — Progress Notes (Signed)
My back hurts most of the time.  She has lower back pain most of the time.  She has learned how to moderate her activities. She has no new trauma.  She is doing her exercises and taking her medicine.  Spine/Pelvis examination:  Inspection:  Overall, sacoiliac joint benign and hips nontender; without crepitus or defects.   Thoracic spine inspection: Alignment normal without kyphosis present   Lumbar spine inspection:  Alignment  with normal lumbar lordosis, without scoliosis apparent.   Thoracic spine palpation:  without tenderness of spinal processes   Lumbar spine palpation: without tenderness of lumbar area; without tightness of lumbar muscles    Range of Motion:   Lumbar flexion, forward flexion is normal without pain or tenderness    Lumbar extension is full without pain or tenderness   Left lateral bend is normal without pain or tenderness   Right lateral bend is normal without pain or tenderness   Straight leg raising is normal  Strength & tone: normal   Stability overall normal stability Encounter Diagnosis  Name Primary?   Chronic midline low back pain without sciatica Yes   Return in six weeks.  I will refill her prednisone which helps.  She is to take it when needed.  Call if any problem.  Precautions discussed.  Electronically Signed Darreld Mclean, MD 5/29/20248:50 AM

## 2022-11-21 ENCOUNTER — Ambulatory Visit: Payer: Medicare HMO | Admitting: Orthopaedic Surgery

## 2022-12-04 ENCOUNTER — Telehealth: Payer: Self-pay | Admitting: Orthopaedic Surgery

## 2022-12-17 DIAGNOSIS — M779 Enthesopathy, unspecified: Secondary | ICD-10-CM | POA: Diagnosis not present

## 2022-12-17 DIAGNOSIS — M79671 Pain in right foot: Secondary | ICD-10-CM | POA: Diagnosis not present

## 2022-12-17 DIAGNOSIS — M79674 Pain in right toe(s): Secondary | ICD-10-CM | POA: Diagnosis not present

## 2022-12-17 DIAGNOSIS — S93331D Other subluxation of right foot, subsequent encounter: Secondary | ICD-10-CM | POA: Diagnosis not present

## 2022-12-19 DIAGNOSIS — H5213 Myopia, bilateral: Secondary | ICD-10-CM | POA: Diagnosis not present

## 2023-01-01 ENCOUNTER — Encounter: Payer: Self-pay | Admitting: Orthopaedic Surgery

## 2023-01-01 ENCOUNTER — Ambulatory Visit: Payer: Medicare HMO | Admitting: Orthopaedic Surgery

## 2023-01-01 VITALS — BP 135/81 | HR 91 | Ht 62.0 in | Wt 162.0 lb

## 2023-01-01 DIAGNOSIS — G8929 Other chronic pain: Secondary | ICD-10-CM

## 2023-01-01 DIAGNOSIS — M545 Low back pain, unspecified: Secondary | ICD-10-CM | POA: Diagnosis not present

## 2023-01-01 NOTE — Progress Notes (Signed)
My back is about the same.  She has good and bad days.  More good days during the summer.  She is active.  She has no new trauma to the back, no numbness, no weakness.  Spine/Pelvis examination:  Inspection:  Overall, sacoiliac joint benign and hips nontender; without crepitus or defects.   Thoracic spine inspection: Alignment normal without kyphosis present   Lumbar spine inspection:  Alignment  with normal lumbar lordosis, without scoliosis apparent.   Thoracic spine palpation:  without tenderness of spinal processes   Lumbar spine palpation: without tenderness of lumbar area; without tightness of lumbar muscles    Range of Motion:   Lumbar flexion, forward flexion is normal without pain or tenderness    Lumbar extension is full without pain or tenderness   Left lateral bend is normal without pain or tenderness   Right lateral bend is normal without pain or tenderness   Straight leg raising is normal  Strength & tone: normal   Stability overall normal stability  Encounter Diagnosis  Name Primary?   Chronic midline low back pain without sciatica Yes   Continue medicine and exercises.  Return in three months.  Call if any problem.  Precautions discussed.  'Electronically Signed Darreld Mclean, MD 7/10/20248:36 AM

## 2023-01-28 DIAGNOSIS — S93331D Other subluxation of right foot, subsequent encounter: Secondary | ICD-10-CM | POA: Diagnosis not present

## 2023-01-28 DIAGNOSIS — M79674 Pain in right toe(s): Secondary | ICD-10-CM | POA: Diagnosis not present

## 2023-01-28 DIAGNOSIS — M779 Enthesopathy, unspecified: Secondary | ICD-10-CM | POA: Diagnosis not present

## 2023-01-28 DIAGNOSIS — L110 Acquired keratosis follicularis: Secondary | ICD-10-CM | POA: Diagnosis not present

## 2023-01-28 DIAGNOSIS — M79671 Pain in right foot: Secondary | ICD-10-CM | POA: Diagnosis not present

## 2023-02-07 DIAGNOSIS — R7303 Prediabetes: Secondary | ICD-10-CM | POA: Diagnosis not present

## 2023-02-07 DIAGNOSIS — M810 Age-related osteoporosis without current pathological fracture: Secondary | ICD-10-CM | POA: Diagnosis not present

## 2023-02-07 DIAGNOSIS — E782 Mixed hyperlipidemia: Secondary | ICD-10-CM | POA: Diagnosis not present

## 2023-02-10 ENCOUNTER — Other Ambulatory Visit: Payer: Self-pay | Admitting: Orthopaedic Surgery

## 2023-02-13 DIAGNOSIS — M545 Low back pain, unspecified: Secondary | ICD-10-CM | POA: Diagnosis not present

## 2023-02-13 DIAGNOSIS — F419 Anxiety disorder, unspecified: Secondary | ICD-10-CM | POA: Diagnosis not present

## 2023-02-13 DIAGNOSIS — E669 Obesity, unspecified: Secondary | ICD-10-CM | POA: Diagnosis not present

## 2023-02-13 DIAGNOSIS — M79671 Pain in right foot: Secondary | ICD-10-CM | POA: Diagnosis not present

## 2023-02-13 DIAGNOSIS — J449 Chronic obstructive pulmonary disease, unspecified: Secondary | ICD-10-CM | POA: Diagnosis not present

## 2023-02-13 DIAGNOSIS — M8008XD Age-related osteoporosis with current pathological fracture, vertebra(e), subsequent encounter for fracture with routine healing: Secondary | ICD-10-CM | POA: Diagnosis not present

## 2023-02-13 DIAGNOSIS — F17210 Nicotine dependence, cigarettes, uncomplicated: Secondary | ICD-10-CM | POA: Diagnosis not present

## 2023-02-13 DIAGNOSIS — E782 Mixed hyperlipidemia: Secondary | ICD-10-CM | POA: Diagnosis not present

## 2023-02-13 DIAGNOSIS — I10 Essential (primary) hypertension: Secondary | ICD-10-CM | POA: Diagnosis not present

## 2023-02-13 DIAGNOSIS — R7303 Prediabetes: Secondary | ICD-10-CM | POA: Diagnosis not present

## 2023-02-13 DIAGNOSIS — M25561 Pain in right knee: Secondary | ICD-10-CM | POA: Diagnosis not present

## 2023-02-13 DIAGNOSIS — F17290 Nicotine dependence, other tobacco product, uncomplicated: Secondary | ICD-10-CM | POA: Diagnosis not present

## 2023-02-13 IMAGING — MG MM DIGITAL SCREENING BILAT W/ TOMO AND CAD
8 series · 8 of 24 positions shown · non-contrast
Comparison: Previous exam(s).

CLINICAL DATA: Screening.

EXAM:
DIGITAL SCREENING BILATERAL MAMMOGRAM WITH TOMOSYNTHESIS AND CAD
TECHNIQUE: Bilateral screening digital craniocaudal and mediolateral oblique
mammograms were obtained. Bilateral screening digital breast
tomosynthesis was performed. The images were evaluated with
computer-aided detection.

[L CC synth-2D]
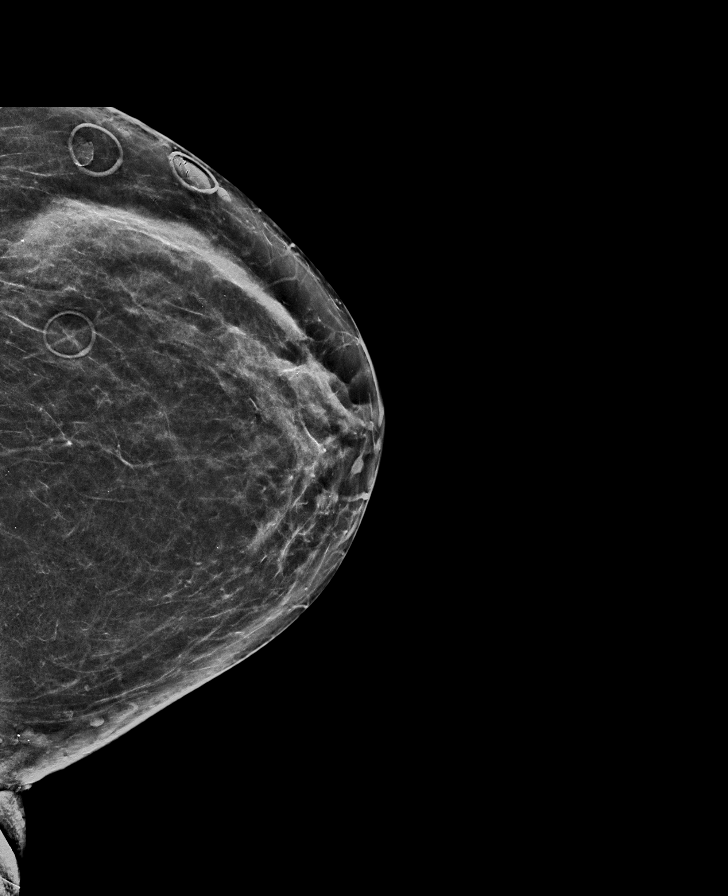

[R MLO synth-2D]
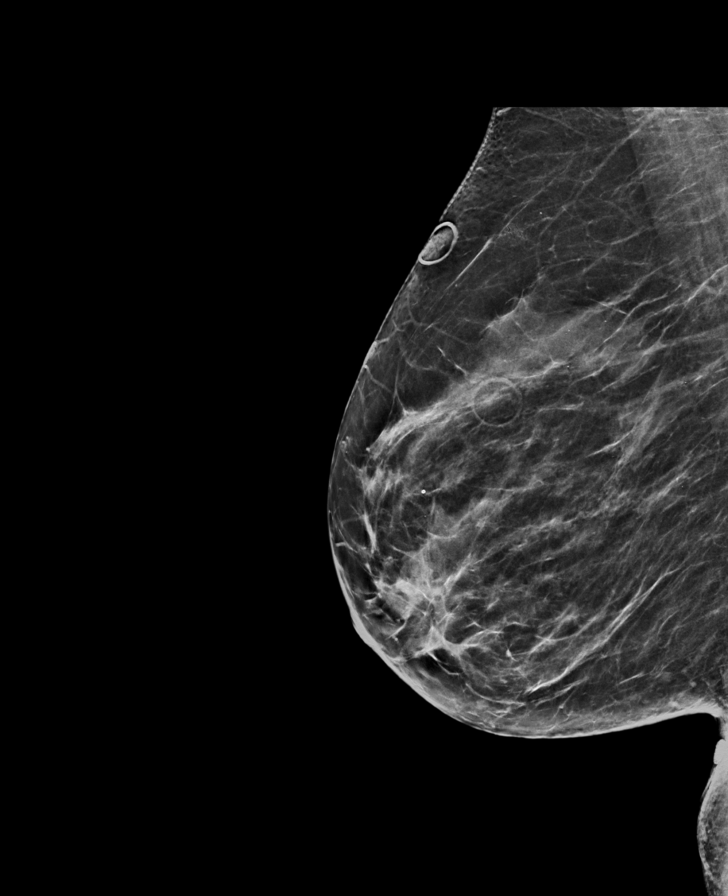

[L MLO synth-2D]
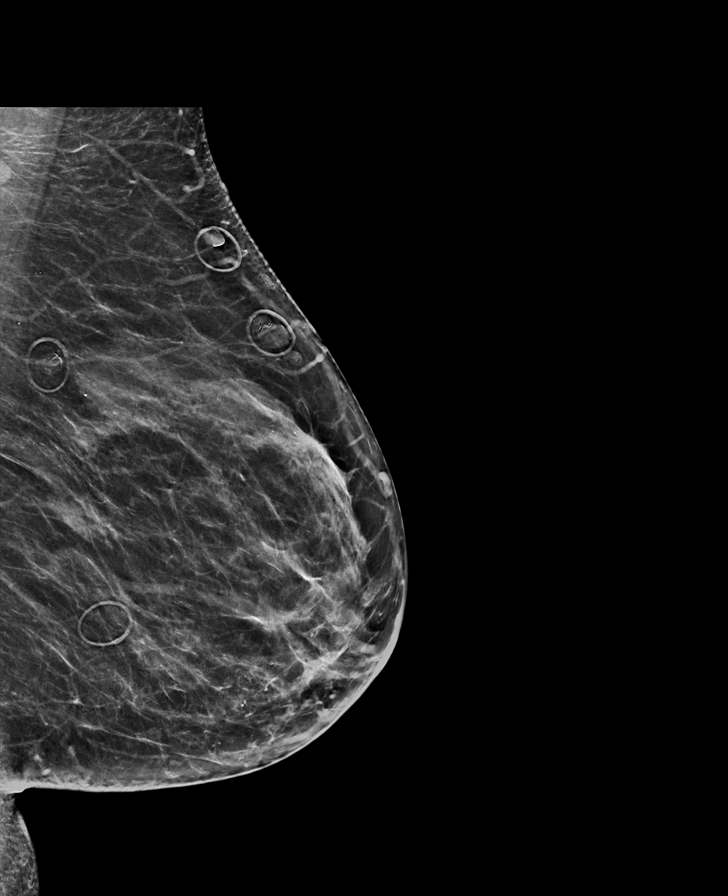

[R CC synth-2D]
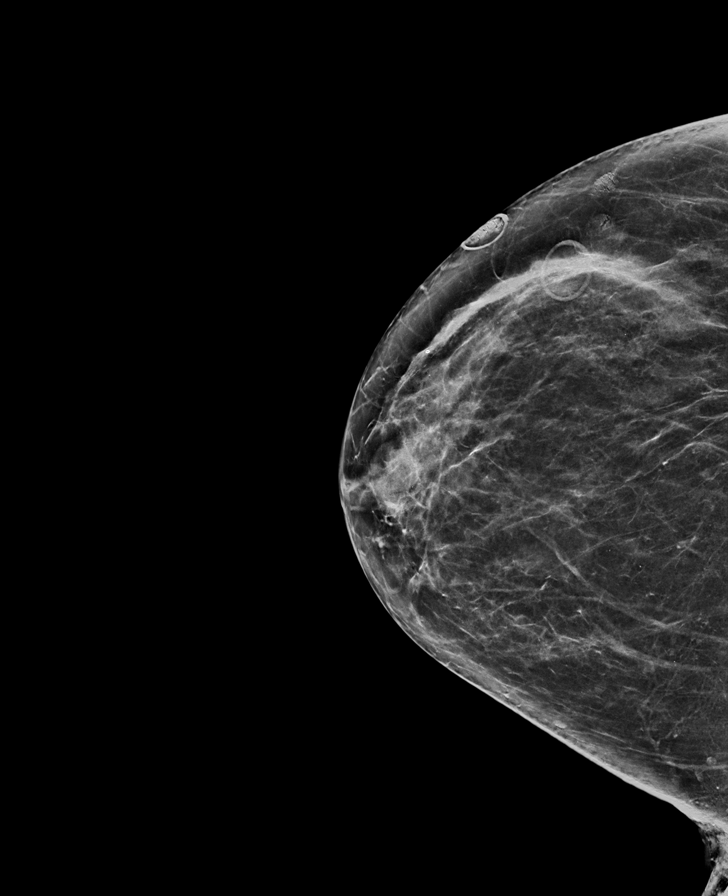

[L CC tomo · tomo slice 33/66.0]
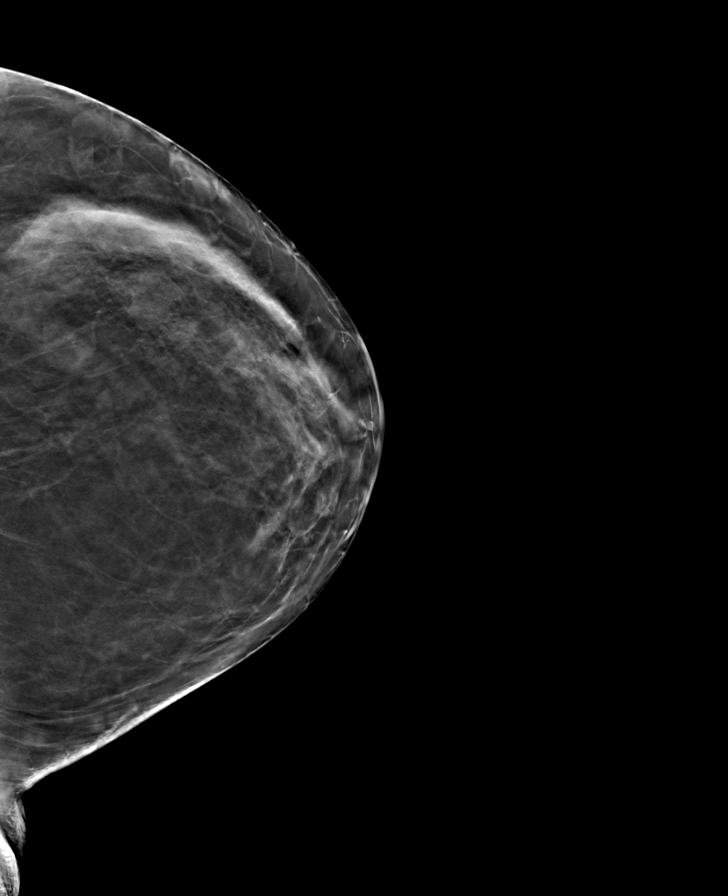

[L MLO tomo · tomo slice 35/68.0]
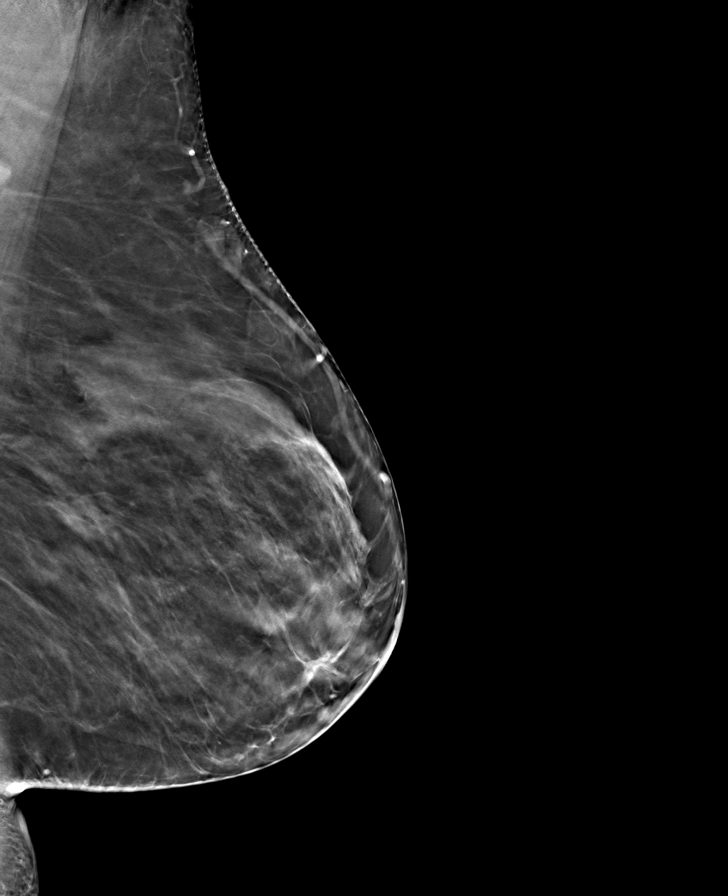

[R MLO tomo · tomo slice 35/68.0]
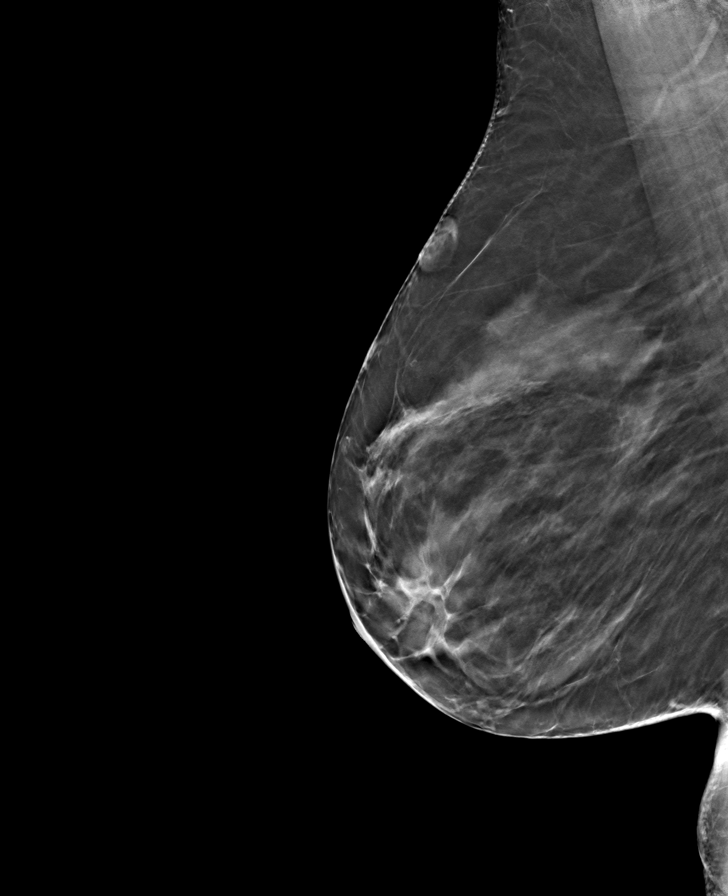

[R CC tomo · tomo slice 33/66.0]
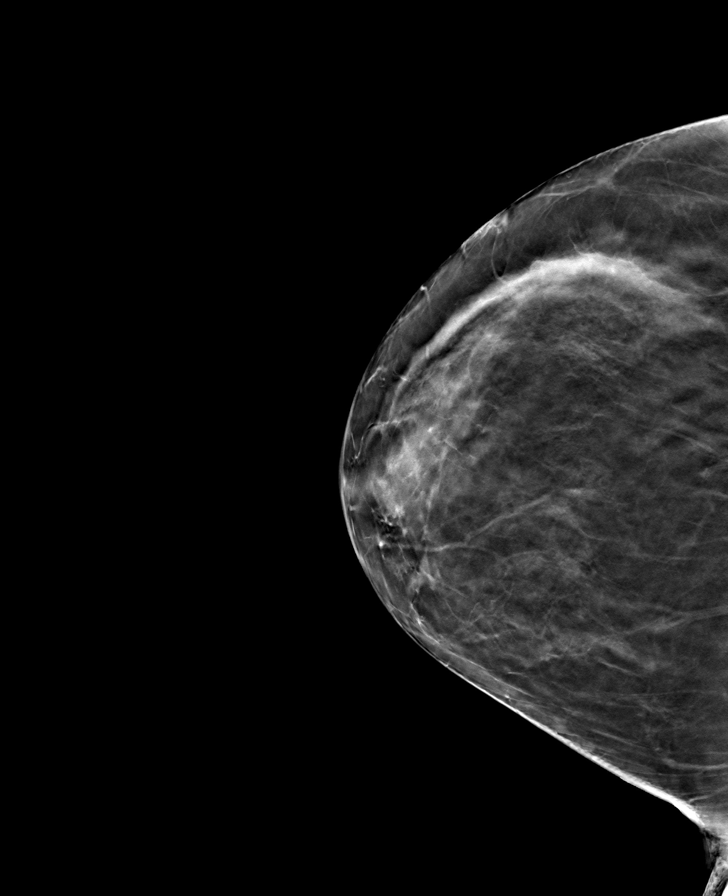

[8 of 24 positions shown; findings below may reference images not displayed]

ACR Breast Density Category c: The breast tissue is heterogeneously
dense, which may obscure small masses.
FINDINGS: There are no findings suspicious for malignancy.
IMPRESSION: No mammographic evidence of malignancy. A result letter of this
screening mammogram will be mailed directly to the patient.

RECOMMENDATION:
Screening mammogram in one year. (Code:Q3-W-BC3)

BI-RADS CATEGORY  1: Negative.

## 2023-03-03 DIAGNOSIS — H2511 Age-related nuclear cataract, right eye: Secondary | ICD-10-CM | POA: Diagnosis not present

## 2023-03-03 DIAGNOSIS — H40013 Open angle with borderline findings, low risk, bilateral: Secondary | ICD-10-CM | POA: Diagnosis not present

## 2023-03-03 DIAGNOSIS — H25013 Cortical age-related cataract, bilateral: Secondary | ICD-10-CM | POA: Diagnosis not present

## 2023-03-03 DIAGNOSIS — H2513 Age-related nuclear cataract, bilateral: Secondary | ICD-10-CM | POA: Diagnosis not present

## 2023-03-03 DIAGNOSIS — H25043 Posterior subcapsular polar age-related cataract, bilateral: Secondary | ICD-10-CM | POA: Diagnosis not present

## 2023-03-04 ENCOUNTER — Telehealth: Payer: Self-pay | Admitting: Orthopaedic Surgery

## 2023-03-04 NOTE — Telephone Encounter (Signed)
Returned a call to [employer] w/[employer] regarding this patient.  LVM advising that we can not discuss anything w/him w/out the patient's consent and that we do not do anything w/billing here and left him the billing #.

## 2023-03-11 DIAGNOSIS — L565 Disseminated superficial actinic porokeratosis (DSAP): Secondary | ICD-10-CM | POA: Diagnosis not present

## 2023-03-11 DIAGNOSIS — M79671 Pain in right foot: Secondary | ICD-10-CM | POA: Diagnosis not present

## 2023-03-11 DIAGNOSIS — M79674 Pain in right toe(s): Secondary | ICD-10-CM | POA: Diagnosis not present

## 2023-03-11 DIAGNOSIS — S93331D Other subluxation of right foot, subsequent encounter: Secondary | ICD-10-CM | POA: Diagnosis not present

## 2023-04-02 ENCOUNTER — Encounter: Payer: Self-pay | Admitting: Orthopaedic Surgery

## 2023-04-02 ENCOUNTER — Ambulatory Visit: Payer: Medicare HMO | Admitting: Orthopaedic Surgery

## 2023-04-02 VITALS — BP 129/78 | HR 101 | Ht 62.0 in | Wt 168.0 lb

## 2023-04-02 DIAGNOSIS — M545 Low back pain, unspecified: Secondary | ICD-10-CM

## 2023-04-02 DIAGNOSIS — G8929 Other chronic pain: Secondary | ICD-10-CM | POA: Diagnosis not present

## 2023-04-02 NOTE — Progress Notes (Signed)
I am about the same.  She has lower back pain, worse with cold rainy days.  She has no new trauma.  She is doing fairly well. She does her exercises and takes her medicine.  ROM of the lower back is good, NV intact, muscle tone and strength normal.  Encounter Diagnosis  Name Primary?   Chronic midline low back pain without sciatica Yes   Return in three months.  Call if any problem.  Precautions discussed.  Electronically Signed Darreld Mclean, MD 10/9/20248:19 AM

## 2023-04-15 ENCOUNTER — Telehealth: Payer: Self-pay | Admitting: Orthopaedic Surgery

## 2023-04-15 DIAGNOSIS — M79671 Pain in right foot: Secondary | ICD-10-CM | POA: Diagnosis not present

## 2023-04-15 DIAGNOSIS — L565 Disseminated superficial actinic porokeratosis (DSAP): Secondary | ICD-10-CM | POA: Diagnosis not present

## 2023-04-15 DIAGNOSIS — S93331D Other subluxation of right foot, subsequent encounter: Secondary | ICD-10-CM | POA: Diagnosis not present

## 2023-04-15 DIAGNOSIS — M79674 Pain in right toe(s): Secondary | ICD-10-CM | POA: Diagnosis not present

## 2023-04-29 DIAGNOSIS — H40033 Anatomical narrow angle, bilateral: Secondary | ICD-10-CM | POA: Diagnosis not present

## 2023-04-29 DIAGNOSIS — H2513 Age-related nuclear cataract, bilateral: Secondary | ICD-10-CM | POA: Diagnosis not present

## 2023-04-29 DIAGNOSIS — D492 Neoplasm of unspecified behavior of bone, soft tissue, and skin: Secondary | ICD-10-CM | POA: Diagnosis not present

## 2023-05-10 DIAGNOSIS — R030 Elevated blood-pressure reading, without diagnosis of hypertension: Secondary | ICD-10-CM | POA: Diagnosis not present

## 2023-05-10 DIAGNOSIS — H109 Unspecified conjunctivitis: Secondary | ICD-10-CM | POA: Diagnosis not present

## 2023-05-10 DIAGNOSIS — E669 Obesity, unspecified: Secondary | ICD-10-CM | POA: Diagnosis not present

## 2023-05-10 DIAGNOSIS — Z6830 Body mass index (BMI) 30.0-30.9, adult: Secondary | ICD-10-CM | POA: Diagnosis not present

## 2023-05-19 DIAGNOSIS — H1089 Other conjunctivitis: Secondary | ICD-10-CM | POA: Diagnosis not present

## 2023-05-20 DIAGNOSIS — M79671 Pain in right foot: Secondary | ICD-10-CM | POA: Diagnosis not present

## 2023-05-20 DIAGNOSIS — M779 Enthesopathy, unspecified: Secondary | ICD-10-CM | POA: Diagnosis not present

## 2023-05-20 DIAGNOSIS — M79674 Pain in right toe(s): Secondary | ICD-10-CM | POA: Diagnosis not present

## 2023-05-20 DIAGNOSIS — S93331D Other subluxation of right foot, subsequent encounter: Secondary | ICD-10-CM | POA: Diagnosis not present

## 2023-05-30 ENCOUNTER — Telehealth: Payer: Self-pay | Admitting: Orthopaedic Surgery

## 2023-06-02 ENCOUNTER — Other Ambulatory Visit (HOSPITAL_COMMUNITY): Payer: Self-pay | Admitting: Internal Medicine

## 2023-06-02 DIAGNOSIS — Z1231 Encounter for screening mammogram for malignant neoplasm of breast: Secondary | ICD-10-CM

## 2023-06-11 DIAGNOSIS — L821 Other seborrheic keratosis: Secondary | ICD-10-CM | POA: Diagnosis not present

## 2023-06-11 DIAGNOSIS — D492 Neoplasm of unspecified behavior of bone, soft tissue, and skin: Secondary | ICD-10-CM | POA: Diagnosis not present

## 2023-06-12 DIAGNOSIS — S93331D Other subluxation of right foot, subsequent encounter: Secondary | ICD-10-CM | POA: Diagnosis not present

## 2023-06-12 DIAGNOSIS — M79674 Pain in right toe(s): Secondary | ICD-10-CM | POA: Diagnosis not present

## 2023-06-12 DIAGNOSIS — L565 Disseminated superficial actinic porokeratosis (DSAP): Secondary | ICD-10-CM | POA: Diagnosis not present

## 2023-06-12 DIAGNOSIS — M79671 Pain in right foot: Secondary | ICD-10-CM | POA: Diagnosis not present

## 2023-06-16 ENCOUNTER — Ambulatory Visit (HOSPITAL_COMMUNITY)
Admission: RE | Admit: 2023-06-16 | Discharge: 2023-06-16 | Disposition: A | Discharge status: Home / Self Care | Payer: Medicare HMO | Source: Ambulatory Visit | Attending: Internal Medicine | Admitting: Internal Medicine

## 2023-06-16 DIAGNOSIS — Z1231 Encounter for screening mammogram for malignant neoplasm of breast: Secondary | ICD-10-CM | POA: Diagnosis not present

## 2023-07-02 ENCOUNTER — Ambulatory Visit (INDEPENDENT_AMBULATORY_CARE_PROVIDER_SITE_OTHER): Payer: PPO | Admitting: Orthopaedic Surgery

## 2023-07-02 ENCOUNTER — Encounter: Payer: Self-pay | Admitting: Orthopaedic Surgery

## 2023-07-02 VITALS — Ht 61.0 in | Wt 167.0 lb

## 2023-07-02 DIAGNOSIS — G8929 Other chronic pain: Secondary | ICD-10-CM

## 2023-07-02 DIAGNOSIS — M545 Low back pain, unspecified: Secondary | ICD-10-CM | POA: Diagnosis not present

## 2023-07-02 MED ORDER — HYDROCODONE-ACETAMINOPHEN 5-325 MG PO TABS: 1.0000 | ORAL_TABLET | Freq: Four times a day (QID) | ORAL | 0 refills | Status: DC | PRN

## 2023-07-02 NOTE — Progress Notes (Signed)
 My back is hurting today.  It is very cold today, around 20 degrees.  She has more back pain with the cold weather.  She has no new trauma.  She is active and doing her exercises.  She has no weakness.  Spine/Pelvis examination:  Inspection:  Overall, sacoiliac joint benign and hips nontender; without crepitus or defects.   Thoracic spine inspection: Alignment normal without kyphosis present   Lumbar spine inspection:  Alignment  with normal lumbar lordosis, without scoliosis apparent.   Thoracic spine palpation:  without tenderness of spinal processes   Lumbar spine palpation: without tenderness of lumbar area; without tightness of lumbar muscles    Range of Motion:   Lumbar flexion, forward flexion is normal without pain or tenderness    Lumbar extension is full without pain or tenderness   Left lateral bend is normal without pain or tenderness   Right lateral bend is normal without pain or tenderness   Straight leg raising is normal  Strength & tone: normal   Stability overall normal stability  Encounter Diagnosis  Name Primary?   Chronic midline low back pain without sciatica Yes   I have reviewed the [place]  Controlled Substance Reporting System web site prior to prescribing narcotic medicine for this patient.  Return in three months.  Call if any problem.  Precautions discussed.  Electronically Signed Lemond Stable, MD 1/8/20258:29 AM

## 2023-07-08 DIAGNOSIS — H2511 Age-related nuclear cataract, right eye: Secondary | ICD-10-CM | POA: Diagnosis not present

## 2023-07-10 ENCOUNTER — Telehealth: Payer: Self-pay | Admitting: Orthopaedic Surgery

## 2023-07-10 NOTE — Telephone Encounter (Signed)
Dr. Hilda Lias pt's - spoke w/the patient, she stated that she was seen 07/01/22 and she still hasn't been able to get her meds.  [facility] is telling her it needs a prior autho.  She stated that she thought someone here was working on it.  She would like to know what is going on.

## 2023-07-10 NOTE — Telephone Encounter (Signed)
Attempted to submit request on CoverMyMeds and advised PA was not needed for pt insurance. Called [facility] to see if they are seeing anymore concerns on their end  or if pt is able to fill medication. Advised pt medication has been filled, and they no longer see the request for PA. Called and left message with information on VM with call back #.

## 2023-07-11 ENCOUNTER — Telehealth: Payer: Self-pay | Admitting: Orthopaedic Surgery

## 2023-07-11 NOTE — Telephone Encounter (Signed)
 Ellen Reynolds

## 2023-07-11 NOTE — Telephone Encounter (Signed)
Dr. Sanjuan Dame pt - pt lvm stating that she wants to speak to a nurse, stated that it does require a PA - [phone]

## 2023-07-14 ENCOUNTER — Telehealth: Payer: Self-pay | Admitting: *Deleted

## 2023-07-14 NOTE — Telephone Encounter (Signed)
Patient needs prior authr for medication Hydrocodone 5-325 mg [employer]

## 2023-07-17 DIAGNOSIS — L565 Disseminated superficial actinic porokeratosis (DSAP): Secondary | ICD-10-CM | POA: Diagnosis not present

## 2023-07-17 DIAGNOSIS — S93331D Other subluxation of right foot, subsequent encounter: Secondary | ICD-10-CM | POA: Diagnosis not present

## 2023-07-17 DIAGNOSIS — M79671 Pain in right foot: Secondary | ICD-10-CM | POA: Diagnosis not present

## 2023-07-17 DIAGNOSIS — M79674 Pain in right toe(s): Secondary | ICD-10-CM | POA: Diagnosis not present

## 2023-08-12 DIAGNOSIS — M810 Age-related osteoporosis without current pathological fracture: Secondary | ICD-10-CM | POA: Diagnosis not present

## 2023-08-12 DIAGNOSIS — R7303 Prediabetes: Secondary | ICD-10-CM | POA: Diagnosis not present

## 2023-08-12 DIAGNOSIS — E782 Mixed hyperlipidemia: Secondary | ICD-10-CM | POA: Diagnosis not present

## 2023-08-14 DIAGNOSIS — H2512 Age-related nuclear cataract, left eye: Secondary | ICD-10-CM | POA: Diagnosis not present

## 2023-08-18 DIAGNOSIS — E669 Obesity, unspecified: Secondary | ICD-10-CM | POA: Diagnosis not present

## 2023-08-18 DIAGNOSIS — R7303 Prediabetes: Secondary | ICD-10-CM | POA: Diagnosis not present

## 2023-08-18 DIAGNOSIS — F17290 Nicotine dependence, other tobacco product, uncomplicated: Secondary | ICD-10-CM | POA: Diagnosis not present

## 2023-08-18 DIAGNOSIS — E782 Mixed hyperlipidemia: Secondary | ICD-10-CM | POA: Diagnosis not present

## 2023-08-18 DIAGNOSIS — M79671 Pain in right foot: Secondary | ICD-10-CM | POA: Diagnosis not present

## 2023-08-18 DIAGNOSIS — F419 Anxiety disorder, unspecified: Secondary | ICD-10-CM | POA: Diagnosis not present

## 2023-08-18 DIAGNOSIS — M545 Low back pain, unspecified: Secondary | ICD-10-CM | POA: Diagnosis not present

## 2023-08-18 DIAGNOSIS — M25561 Pain in right knee: Secondary | ICD-10-CM | POA: Diagnosis not present

## 2023-08-18 DIAGNOSIS — M810 Age-related osteoporosis without current pathological fracture: Secondary | ICD-10-CM | POA: Diagnosis not present

## 2023-08-18 DIAGNOSIS — J449 Chronic obstructive pulmonary disease, unspecified: Secondary | ICD-10-CM | POA: Diagnosis not present

## 2023-08-18 DIAGNOSIS — M8008XD Age-related osteoporosis with current pathological fracture, vertebra(e), subsequent encounter for fracture with routine healing: Secondary | ICD-10-CM | POA: Diagnosis not present

## 2023-08-18 DIAGNOSIS — I10 Essential (primary) hypertension: Secondary | ICD-10-CM | POA: Diagnosis not present

## 2023-08-19 DIAGNOSIS — H2512 Age-related nuclear cataract, left eye: Secondary | ICD-10-CM | POA: Diagnosis not present

## 2023-09-09 DIAGNOSIS — L110 Acquired keratosis follicularis: Secondary | ICD-10-CM | POA: Diagnosis not present

## 2023-09-09 DIAGNOSIS — M79672 Pain in left foot: Secondary | ICD-10-CM | POA: Diagnosis not present

## 2023-09-09 DIAGNOSIS — M779 Enthesopathy, unspecified: Secondary | ICD-10-CM | POA: Diagnosis not present

## 2023-09-09 DIAGNOSIS — M79671 Pain in right foot: Secondary | ICD-10-CM | POA: Diagnosis not present

## 2023-09-09 DIAGNOSIS — M79674 Pain in right toe(s): Secondary | ICD-10-CM | POA: Diagnosis not present

## 2023-09-09 DIAGNOSIS — M79675 Pain in left toe(s): Secondary | ICD-10-CM | POA: Diagnosis not present

## 2023-09-15 ENCOUNTER — Telehealth: Payer: Self-pay | Admitting: Orthopaedic Surgery

## 2023-09-17 ENCOUNTER — Other Ambulatory Visit (INDEPENDENT_AMBULATORY_CARE_PROVIDER_SITE_OTHER)

## 2023-09-17 ENCOUNTER — Encounter: Payer: Self-pay | Admitting: Orthopaedic Surgery

## 2023-09-17 ENCOUNTER — Ambulatory Visit: Admitting: Orthopaedic Surgery

## 2023-09-17 VITALS — BP 131/91 | HR 80 | Ht 61.0 in | Wt 170.0 lb

## 2023-09-17 DIAGNOSIS — M545 Low back pain, unspecified: Secondary | ICD-10-CM | POA: Diagnosis not present

## 2023-09-17 DIAGNOSIS — M25552 Pain in left hip: Secondary | ICD-10-CM | POA: Diagnosis not present

## 2023-09-17 DIAGNOSIS — M25551 Pain in right hip: Secondary | ICD-10-CM

## 2023-09-17 NOTE — Patient Instructions (Addendum)
 While we are working on your approval for MRI please go ahead and call to schedule your appointment with [facility] within at least one (1) week.   Central Scheduling [phone]

## 2023-09-17 NOTE — Progress Notes (Signed)
 My back is worse.  She has had more and more back pain, centrally and to the right.  She has right hip pain also but no radiation to the lower leg.  She has no trauma, no weakness.  She has taken her pain medicine and done her exercises without much help.  She is getting worse.  Lower back is painful, no spasm.  ROM decreased.  NV intact.  ROM right hip is good but tender over trochanteric area.  Gait is good.  Muscle tone and strength are normal.  X-rays were done of the right hip and lower back, reported separately.  Encounter Diagnoses  Name Primary?   Lumbar pain Yes   Pain in right hip    She got her pain medicine on 09-15-23.  Continue this.  I will get MRI of the lumbar spine.  Return in three weeks.  Call if any problem.  Precautions discussed.  Electronically Signed Darreld Mclean, MD 3/26/20258:32 AM

## 2023-09-24 DIAGNOSIS — C44311 Basal cell carcinoma of skin of nose: Secondary | ICD-10-CM | POA: Diagnosis not present

## 2023-09-24 DIAGNOSIS — L821 Other seborrheic keratosis: Secondary | ICD-10-CM | POA: Diagnosis not present

## 2023-09-24 DIAGNOSIS — L820 Inflamed seborrheic keratosis: Secondary | ICD-10-CM | POA: Diagnosis not present

## 2023-09-24 DIAGNOSIS — D485 Neoplasm of uncertain behavior of skin: Secondary | ICD-10-CM | POA: Diagnosis not present

## 2023-09-24 DIAGNOSIS — L2989 Other pruritus: Secondary | ICD-10-CM | POA: Diagnosis not present

## 2023-09-24 DIAGNOSIS — Z789 Other specified health status: Secondary | ICD-10-CM | POA: Diagnosis not present

## 2023-09-24 DIAGNOSIS — R208 Other disturbances of skin sensation: Secondary | ICD-10-CM | POA: Diagnosis not present

## 2023-09-24 DIAGNOSIS — L814 Other melanin hyperpigmentation: Secondary | ICD-10-CM | POA: Diagnosis not present

## 2023-09-24 DIAGNOSIS — L538 Other specified erythematous conditions: Secondary | ICD-10-CM | POA: Diagnosis not present

## 2023-09-24 DIAGNOSIS — D225 Melanocytic nevi of trunk: Secondary | ICD-10-CM | POA: Diagnosis not present

## 2023-10-01 ENCOUNTER — Ambulatory Visit: Payer: PPO | Admitting: Orthopaedic Surgery

## 2023-11-03 ENCOUNTER — Telehealth: Payer: Self-pay | Admitting: Orthopaedic Surgery

## 2023-11-05 ENCOUNTER — Ambulatory Visit: Admitting: Orthopaedic Surgery

## 2023-11-18 DIAGNOSIS — C44311 Basal cell carcinoma of skin of nose: Secondary | ICD-10-CM | POA: Diagnosis not present

## 2023-11-19 ENCOUNTER — Ambulatory Visit: Admitting: Orthopaedic Surgery

## 2023-11-21 DIAGNOSIS — M810 Age-related osteoporosis without current pathological fracture: Secondary | ICD-10-CM | POA: Diagnosis not present

## 2023-11-21 DIAGNOSIS — E782 Mixed hyperlipidemia: Secondary | ICD-10-CM | POA: Diagnosis not present

## 2023-11-21 DIAGNOSIS — R7303 Prediabetes: Secondary | ICD-10-CM | POA: Diagnosis not present

## 2023-11-27 DIAGNOSIS — F17290 Nicotine dependence, other tobacco product, uncomplicated: Secondary | ICD-10-CM | POA: Diagnosis not present

## 2023-11-27 DIAGNOSIS — M25561 Pain in right knee: Secondary | ICD-10-CM | POA: Diagnosis not present

## 2023-11-27 DIAGNOSIS — M545 Low back pain, unspecified: Secondary | ICD-10-CM | POA: Diagnosis not present

## 2023-11-27 DIAGNOSIS — F419 Anxiety disorder, unspecified: Secondary | ICD-10-CM | POA: Diagnosis not present

## 2023-11-27 DIAGNOSIS — R7303 Prediabetes: Secondary | ICD-10-CM | POA: Diagnosis not present

## 2023-11-27 DIAGNOSIS — I10 Essential (primary) hypertension: Secondary | ICD-10-CM | POA: Diagnosis not present

## 2023-11-27 DIAGNOSIS — J449 Chronic obstructive pulmonary disease, unspecified: Secondary | ICD-10-CM | POA: Diagnosis not present

## 2023-11-27 DIAGNOSIS — M79671 Pain in right foot: Secondary | ICD-10-CM | POA: Diagnosis not present

## 2023-11-27 DIAGNOSIS — Z0001 Encounter for general adult medical examination with abnormal findings: Secondary | ICD-10-CM | POA: Diagnosis not present

## 2023-11-27 DIAGNOSIS — M810 Age-related osteoporosis without current pathological fracture: Secondary | ICD-10-CM | POA: Diagnosis not present

## 2023-11-27 DIAGNOSIS — M8008XD Age-related osteoporosis with current pathological fracture, vertebra(e), subsequent encounter for fracture with routine healing: Secondary | ICD-10-CM | POA: Diagnosis not present

## 2023-11-27 DIAGNOSIS — E782 Mixed hyperlipidemia: Secondary | ICD-10-CM | POA: Diagnosis not present

## 2023-12-08 DIAGNOSIS — M4807 Spinal stenosis, lumbosacral region: Secondary | ICD-10-CM | POA: Diagnosis not present

## 2023-12-08 DIAGNOSIS — M47817 Spondylosis without myelopathy or radiculopathy, lumbosacral region: Secondary | ICD-10-CM | POA: Diagnosis not present

## 2023-12-08 DIAGNOSIS — M47816 Spondylosis without myelopathy or radiculopathy, lumbar region: Secondary | ICD-10-CM | POA: Diagnosis not present

## 2023-12-08 DIAGNOSIS — Q7649 Other congenital malformations of spine, not associated with scoliosis: Secondary | ICD-10-CM | POA: Diagnosis not present

## 2023-12-11 DIAGNOSIS — S93331D Other subluxation of right foot, subsequent encounter: Secondary | ICD-10-CM | POA: Diagnosis not present

## 2023-12-11 DIAGNOSIS — M79671 Pain in right foot: Secondary | ICD-10-CM | POA: Diagnosis not present

## 2023-12-11 DIAGNOSIS — M779 Enthesopathy, unspecified: Secondary | ICD-10-CM | POA: Diagnosis not present

## 2023-12-11 DIAGNOSIS — M79674 Pain in right toe(s): Secondary | ICD-10-CM | POA: Diagnosis not present

## 2023-12-22 ENCOUNTER — Other Ambulatory Visit: Payer: Self-pay | Admitting: Orthopaedic Surgery

## 2023-12-24 ENCOUNTER — Encounter: Payer: Self-pay | Admitting: Orthopaedic Surgery

## 2023-12-24 ENCOUNTER — Ambulatory Visit: Admitting: Orthopaedic Surgery

## 2023-12-24 DIAGNOSIS — M545 Low back pain, unspecified: Secondary | ICD-10-CM

## 2023-12-24 NOTE — Progress Notes (Signed)
 My back hurts.  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  She has severe bilateral facet arthropathy at L5-S1 with grade 1 spondylolisthesis and moderate bilateral neuroforaminal stenosis.  I have explained the findings to her.  She appears to understand.  She does not want epidural.  She said they do not help.  She does not want to consider any surgery.  She has pain medicine.  I went over exercises with her again.  She has good ROM of the back today with no spasm, muscle tone and strength normal.  Encounter Diagnosis  Name Primary?   Lumbar pain Yes   Continue pain medicine as needed.  Return in three months.  I have independently reviewed the MRI.    Call if any problem.  Precautions discussed.  Electronically Signed Lemond Stable, MD 7/2/20258:20 AM

## 2024-01-02 DIAGNOSIS — Z6830 Body mass index (BMI) 30.0-30.9, adult: Secondary | ICD-10-CM | POA: Diagnosis not present

## 2024-01-02 DIAGNOSIS — F17210 Nicotine dependence, cigarettes, uncomplicated: Secondary | ICD-10-CM | POA: Diagnosis not present

## 2024-01-02 DIAGNOSIS — M545 Low back pain, unspecified: Secondary | ICD-10-CM | POA: Diagnosis not present

## 2024-01-02 DIAGNOSIS — E669 Obesity, unspecified: Secondary | ICD-10-CM | POA: Diagnosis not present

## 2024-01-02 DIAGNOSIS — J449 Chronic obstructive pulmonary disease, unspecified: Secondary | ICD-10-CM | POA: Diagnosis not present

## 2024-01-02 DIAGNOSIS — G8929 Other chronic pain: Secondary | ICD-10-CM | POA: Diagnosis not present

## 2024-01-02 DIAGNOSIS — I10 Essential (primary) hypertension: Secondary | ICD-10-CM | POA: Diagnosis not present

## 2024-01-02 DIAGNOSIS — J069 Acute upper respiratory infection, unspecified: Secondary | ICD-10-CM | POA: Diagnosis not present

## 2024-01-02 DIAGNOSIS — J441 Chronic obstructive pulmonary disease with (acute) exacerbation: Secondary | ICD-10-CM | POA: Diagnosis not present

## 2024-01-09 ENCOUNTER — Telehealth: Payer: Self-pay

## 2024-01-09 MED ORDER — HYDROCODONE-ACETAMINOPHEN 5-325 MG PO TABS: 1.0000 | ORAL_TABLET | Freq: Four times a day (QID) | ORAL | 0 refills | Status: DC | PRN

## 2024-01-09 NOTE — Telephone Encounter (Signed)
 Hydrocodone -Acetaminophen  5/325 MG  Qty 100 Tablets  PATIENT USES [facility]

## 2024-01-13 DIAGNOSIS — M79674 Pain in right toe(s): Secondary | ICD-10-CM | POA: Diagnosis not present

## 2024-01-13 DIAGNOSIS — S93331D Other subluxation of right foot, subsequent encounter: Secondary | ICD-10-CM | POA: Diagnosis not present

## 2024-01-13 DIAGNOSIS — L565 Disseminated superficial actinic porokeratosis (DSAP): Secondary | ICD-10-CM | POA: Diagnosis not present

## 2024-01-13 DIAGNOSIS — M79671 Pain in right foot: Secondary | ICD-10-CM | POA: Diagnosis not present

## 2024-01-13 DIAGNOSIS — Z48817 Encounter for surgical aftercare following surgery on the skin and subcutaneous tissue: Secondary | ICD-10-CM | POA: Diagnosis not present

## 2024-02-17 DIAGNOSIS — L565 Disseminated superficial actinic porokeratosis (DSAP): Secondary | ICD-10-CM | POA: Diagnosis not present

## 2024-02-17 DIAGNOSIS — M79671 Pain in right foot: Secondary | ICD-10-CM | POA: Diagnosis not present

## 2024-02-17 DIAGNOSIS — M79674 Pain in right toe(s): Secondary | ICD-10-CM | POA: Diagnosis not present

## 2024-02-17 DIAGNOSIS — S93331D Other subluxation of right foot, subsequent encounter: Secondary | ICD-10-CM | POA: Diagnosis not present

## 2024-03-02 ENCOUNTER — Telehealth: Payer: Self-pay | Admitting: Orthopaedic Surgery

## 2024-03-02 ENCOUNTER — Other Ambulatory Visit: Payer: Self-pay | Admitting: Orthopaedic Surgery

## 2024-03-03 DIAGNOSIS — M810 Age-related osteoporosis without current pathological fracture: Secondary | ICD-10-CM | POA: Diagnosis not present

## 2024-03-03 DIAGNOSIS — R7303 Prediabetes: Secondary | ICD-10-CM | POA: Diagnosis not present

## 2024-03-03 DIAGNOSIS — E782 Mixed hyperlipidemia: Secondary | ICD-10-CM | POA: Diagnosis not present

## 2024-03-17 DIAGNOSIS — M810 Age-related osteoporosis without current pathological fracture: Secondary | ICD-10-CM | POA: Diagnosis not present

## 2024-03-17 DIAGNOSIS — Z780 Asymptomatic menopausal state: Secondary | ICD-10-CM | POA: Diagnosis not present

## 2024-03-19 DIAGNOSIS — E782 Mixed hyperlipidemia: Secondary | ICD-10-CM | POA: Diagnosis not present

## 2024-03-19 DIAGNOSIS — I10 Essential (primary) hypertension: Secondary | ICD-10-CM | POA: Diagnosis not present

## 2024-03-19 DIAGNOSIS — M25551 Pain in right hip: Secondary | ICD-10-CM | POA: Diagnosis not present

## 2024-03-19 DIAGNOSIS — J449 Chronic obstructive pulmonary disease, unspecified: Secondary | ICD-10-CM | POA: Diagnosis not present

## 2024-03-19 DIAGNOSIS — M25561 Pain in right knee: Secondary | ICD-10-CM | POA: Diagnosis not present

## 2024-03-19 DIAGNOSIS — D751 Secondary polycythemia: Secondary | ICD-10-CM | POA: Diagnosis not present

## 2024-03-19 DIAGNOSIS — M545 Low back pain, unspecified: Secondary | ICD-10-CM | POA: Diagnosis not present

## 2024-03-19 DIAGNOSIS — F17290 Nicotine dependence, other tobacco product, uncomplicated: Secondary | ICD-10-CM | POA: Diagnosis not present

## 2024-03-19 DIAGNOSIS — M8008XD Age-related osteoporosis with current pathological fracture, vertebra(e), subsequent encounter for fracture with routine healing: Secondary | ICD-10-CM | POA: Diagnosis not present

## 2024-03-19 DIAGNOSIS — R7303 Prediabetes: Secondary | ICD-10-CM | POA: Diagnosis not present

## 2024-03-19 DIAGNOSIS — M810 Age-related osteoporosis without current pathological fracture: Secondary | ICD-10-CM | POA: Diagnosis not present

## 2024-03-19 DIAGNOSIS — F419 Anxiety disorder, unspecified: Secondary | ICD-10-CM | POA: Diagnosis not present

## 2024-03-24 ENCOUNTER — Encounter: Payer: Self-pay | Admitting: Orthopaedic Surgery

## 2024-03-24 ENCOUNTER — Ambulatory Visit: Admitting: Orthopaedic Surgery

## 2024-03-24 DIAGNOSIS — M545 Low back pain, unspecified: Secondary | ICD-10-CM

## 2024-03-24 NOTE — Progress Notes (Signed)
 My back hurts.  She has chronic lower back pain. She is taking her medicine.  She has good and bad days.  She is doing her exercises.  ROM of the back is good, NV intact, muscle tone and strength normal.    Encounter Diagnosis  Name Primary?   Lumbar pain Yes   I have informed the patient I will be retiring from medical practice and from this office on March 25, 2024.  The patient has been offered continuing care with Dr. Margrette or Dr. Onesimo of this office.  The patient may choose another provider and the records will be forwarded after proper signature and notification.  Patient understands and agrees.  Return prn.  Call if any problem.  Precautions discussed.  Electronically Signed Lemond Stable, MD 10/1/20258:56 AM

## 2024-05-04 DIAGNOSIS — M79675 Pain in left toe(s): Secondary | ICD-10-CM | POA: Diagnosis not present

## 2024-05-04 DIAGNOSIS — S93332D Other subluxation of left foot, subsequent encounter: Secondary | ICD-10-CM | POA: Diagnosis not present

## 2024-05-04 DIAGNOSIS — M79672 Pain in left foot: Secondary | ICD-10-CM | POA: Diagnosis not present

## 2024-05-04 DIAGNOSIS — L565 Disseminated superficial actinic porokeratosis (DSAP): Secondary | ICD-10-CM | POA: Diagnosis not present

## 2024-05-07 ENCOUNTER — Other Ambulatory Visit (HOSPITAL_COMMUNITY): Payer: Self-pay | Admitting: Internal Medicine

## 2024-05-07 DIAGNOSIS — Z1231 Encounter for screening mammogram for malignant neoplasm of breast: Secondary | ICD-10-CM

## 2024-05-12 DIAGNOSIS — Z961 Presence of intraocular lens: Secondary | ICD-10-CM | POA: Diagnosis not present

## 2024-05-12 DIAGNOSIS — H0102A Squamous blepharitis right eye, upper and lower eyelids: Secondary | ICD-10-CM | POA: Diagnosis not present

## 2024-05-12 DIAGNOSIS — H0102B Squamous blepharitis left eye, upper and lower eyelids: Secondary | ICD-10-CM | POA: Diagnosis not present

## 2024-05-26 DIAGNOSIS — Z961 Presence of intraocular lens: Secondary | ICD-10-CM | POA: Diagnosis not present

## 2024-05-26 DIAGNOSIS — H0102A Squamous blepharitis right eye, upper and lower eyelids: Secondary | ICD-10-CM | POA: Diagnosis not present

## 2024-05-26 DIAGNOSIS — H0102B Squamous blepharitis left eye, upper and lower eyelids: Secondary | ICD-10-CM | POA: Diagnosis not present

## 2024-06-21 ENCOUNTER — Ambulatory Visit (HOSPITAL_COMMUNITY)
Admission: RE | Admit: 2024-06-21 | Discharge: 2024-06-21 | Disposition: A | Discharge status: Home / Self Care | Source: Ambulatory Visit | Attending: Internal Medicine | Admitting: Internal Medicine

## 2024-06-21 DIAGNOSIS — Z1231 Encounter for screening mammogram for malignant neoplasm of breast: Secondary | ICD-10-CM | POA: Insufficient documentation
